# Patient Record
Sex: Male | Born: 1955 | Race: White | Hispanic: No | Smoking: Former smoker
Health system: Southern US, Community
[De-identification: ages and names within clinical notes are randomized; demographics above are authoritative.]

## PROBLEM LIST (undated history)

## (undated) DIAGNOSIS — D179 Benign lipomatous neoplasm, unspecified: Secondary | ICD-10-CM

## (undated) DIAGNOSIS — G43909 Migraine, unspecified, not intractable, without status migrainosus: Secondary | ICD-10-CM

## (undated) DIAGNOSIS — R51 Headache: Secondary | ICD-10-CM

## (undated) DIAGNOSIS — G40909 Epilepsy, unspecified, not intractable, without status epilepticus: Secondary | ICD-10-CM

## (undated) DIAGNOSIS — E669 Obesity, unspecified: Secondary | ICD-10-CM

## (undated) DIAGNOSIS — D860 Sarcoidosis of lung: Secondary | ICD-10-CM

## (undated) DIAGNOSIS — G4733 Obstructive sleep apnea (adult) (pediatric): Secondary | ICD-10-CM

## (undated) DIAGNOSIS — R079 Chest pain, unspecified: Secondary | ICD-10-CM

## (undated) DIAGNOSIS — R519 Headache, unspecified: Secondary | ICD-10-CM

## (undated) DIAGNOSIS — Z9989 Dependence on other enabling machines and devices: Secondary | ICD-10-CM

## (undated) DIAGNOSIS — K219 Gastro-esophageal reflux disease without esophagitis: Secondary | ICD-10-CM

## (undated) DIAGNOSIS — J189 Pneumonia, unspecified organism: Secondary | ICD-10-CM

## (undated) DIAGNOSIS — F329 Major depressive disorder, single episode, unspecified: Secondary | ICD-10-CM

## (undated) DIAGNOSIS — F32A Depression, unspecified: Secondary | ICD-10-CM

## (undated) DIAGNOSIS — M1990 Unspecified osteoarthritis, unspecified site: Secondary | ICD-10-CM

## (undated) DIAGNOSIS — E041 Nontoxic single thyroid nodule: Secondary | ICD-10-CM

## (undated) DIAGNOSIS — E785 Hyperlipidemia, unspecified: Secondary | ICD-10-CM

## (undated) HISTORY — PX: BIOPSY THYROID: PRO38

## (undated) HISTORY — PX: REVISION TOTAL KNEE ARTHROPLASTY: SUR1280

---
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
## 1979-09-17 HISTORY — PX: KNEE RECONSTRUCTION: SHX5883

## 2001-09-16 HISTORY — PX: TOTAL KNEE ARTHROPLASTY: SHX125

## 2002-07-29 ENCOUNTER — Emergency Department (HOSPITAL_COMMUNITY): Admission: EM | Admit: 2002-07-29 | Discharge: 2002-07-29 | Payer: Self-pay | Admitting: *Deleted

## 2002-07-29 ENCOUNTER — Encounter: Payer: Self-pay | Admitting: *Deleted

## 2003-02-28 ENCOUNTER — Encounter: Payer: Self-pay | Admitting: Orthopedic Surgery

## 2003-03-01 ENCOUNTER — Inpatient Hospital Stay (HOSPITAL_COMMUNITY): Admission: RE | Admit: 2003-03-01 | Discharge: 2003-03-05 | Payer: Self-pay | Admitting: Orthopedic Surgery

## 2003-04-15 ENCOUNTER — Inpatient Hospital Stay (HOSPITAL_COMMUNITY): Admission: EM | Admit: 2003-04-15 | Discharge: 2003-04-17 | Payer: Self-pay | Admitting: *Deleted

## 2003-04-15 ENCOUNTER — Encounter: Payer: Self-pay | Admitting: *Deleted

## 2003-04-16 ENCOUNTER — Encounter: Payer: Self-pay | Admitting: Internal Medicine

## 2003-04-17 ENCOUNTER — Encounter: Payer: Self-pay | Admitting: Internal Medicine

## 2003-07-11 ENCOUNTER — Ambulatory Visit (HOSPITAL_COMMUNITY): Admission: RE | Admit: 2003-07-11 | Discharge: 2003-07-11 | Payer: Self-pay | Admitting: Neurology

## 2003-07-11 ENCOUNTER — Encounter: Payer: Self-pay | Admitting: Neurology

## 2003-12-13 ENCOUNTER — Inpatient Hospital Stay (HOSPITAL_COMMUNITY): Admission: RE | Admit: 2003-12-13 | Discharge: 2003-12-16 | Payer: Self-pay | Admitting: Orthopedic Surgery

## 2005-03-13 ENCOUNTER — Inpatient Hospital Stay (HOSPITAL_COMMUNITY): Admission: EM | Admit: 2005-03-13 | Discharge: 2005-03-15 | Payer: Self-pay | Admitting: Emergency Medicine

## 2005-03-20 ENCOUNTER — Emergency Department (HOSPITAL_COMMUNITY): Admission: EM | Admit: 2005-03-20 | Discharge: 2005-03-20 | Payer: Self-pay | Admitting: Emergency Medicine

## 2005-06-12 ENCOUNTER — Emergency Department (HOSPITAL_COMMUNITY): Admission: EM | Admit: 2005-06-12 | Discharge: 2005-06-12 | Payer: Self-pay | Admitting: Emergency Medicine

## 2005-06-13 ENCOUNTER — Emergency Department (HOSPITAL_COMMUNITY): Admission: EM | Admit: 2005-06-13 | Discharge: 2005-06-13 | Payer: Self-pay | Admitting: Emergency Medicine

## 2005-12-04 ENCOUNTER — Emergency Department (HOSPITAL_COMMUNITY): Admission: EM | Admit: 2005-12-04 | Discharge: 2005-12-04 | Payer: Self-pay | Admitting: Emergency Medicine

## 2005-12-14 ENCOUNTER — Emergency Department (HOSPITAL_COMMUNITY): Admission: EM | Admit: 2005-12-14 | Discharge: 2005-12-14 | Payer: Self-pay | Admitting: Emergency Medicine

## 2005-12-22 ENCOUNTER — Inpatient Hospital Stay (HOSPITAL_COMMUNITY): Admission: EM | Admit: 2005-12-22 | Discharge: 2005-12-23 | Payer: Self-pay | Admitting: Emergency Medicine

## 2005-12-22 ENCOUNTER — Ambulatory Visit: Payer: Self-pay | Admitting: Family Medicine

## 2005-12-23 ENCOUNTER — Inpatient Hospital Stay (HOSPITAL_COMMUNITY): Admission: RE | Admit: 2005-12-23 | Discharge: 2005-12-26 | Payer: Self-pay | Admitting: Psychiatry

## 2005-12-24 ENCOUNTER — Ambulatory Visit: Payer: Self-pay | Admitting: Psychiatry

## 2009-09-16 HISTORY — PX: POSTERIOR LUMBAR FUSION: SHX6036

## 2013-06-04 DIAGNOSIS — J069 Acute upper respiratory infection, unspecified: Secondary | ICD-10-CM | POA: Diagnosis not present

## 2013-06-04 DIAGNOSIS — J019 Acute sinusitis, unspecified: Secondary | ICD-10-CM | POA: Diagnosis not present

## 2013-06-25 DIAGNOSIS — R197 Diarrhea, unspecified: Secondary | ICD-10-CM | POA: Diagnosis not present

## 2013-06-25 DIAGNOSIS — R1013 Epigastric pain: Secondary | ICD-10-CM | POA: Diagnosis not present

## 2013-07-19 DIAGNOSIS — F341 Dysthymic disorder: Secondary | ICD-10-CM | POA: Diagnosis not present

## 2013-07-19 DIAGNOSIS — R569 Unspecified convulsions: Secondary | ICD-10-CM | POA: Diagnosis not present

## 2013-09-22 DIAGNOSIS — R059 Cough, unspecified: Secondary | ICD-10-CM | POA: Diagnosis not present

## 2013-09-22 DIAGNOSIS — R05 Cough: Secondary | ICD-10-CM | POA: Diagnosis not present

## 2013-10-11 DIAGNOSIS — R079 Chest pain, unspecified: Secondary | ICD-10-CM | POA: Diagnosis not present

## 2013-10-18 DIAGNOSIS — M9981 Other biomechanical lesions of cervical region: Secondary | ICD-10-CM | POA: Diagnosis not present

## 2013-10-18 DIAGNOSIS — M999 Biomechanical lesion, unspecified: Secondary | ICD-10-CM | POA: Diagnosis not present

## 2013-10-18 DIAGNOSIS — M47814 Spondylosis without myelopathy or radiculopathy, thoracic region: Secondary | ICD-10-CM | POA: Diagnosis not present

## 2013-10-18 DIAGNOSIS — M5030 Other cervical disc degeneration, unspecified cervical region: Secondary | ICD-10-CM | POA: Diagnosis not present

## 2013-10-18 DIAGNOSIS — M47817 Spondylosis without myelopathy or radiculopathy, lumbosacral region: Secondary | ICD-10-CM | POA: Diagnosis not present

## 2013-10-20 DIAGNOSIS — M999 Biomechanical lesion, unspecified: Secondary | ICD-10-CM | POA: Diagnosis not present

## 2013-10-20 DIAGNOSIS — M9981 Other biomechanical lesions of cervical region: Secondary | ICD-10-CM | POA: Diagnosis not present

## 2013-10-20 DIAGNOSIS — M47814 Spondylosis without myelopathy or radiculopathy, thoracic region: Secondary | ICD-10-CM | POA: Diagnosis not present

## 2013-10-20 DIAGNOSIS — M47817 Spondylosis without myelopathy or radiculopathy, lumbosacral region: Secondary | ICD-10-CM | POA: Diagnosis not present

## 2013-10-20 DIAGNOSIS — M5030 Other cervical disc degeneration, unspecified cervical region: Secondary | ICD-10-CM | POA: Diagnosis not present

## 2013-10-22 DIAGNOSIS — M999 Biomechanical lesion, unspecified: Secondary | ICD-10-CM | POA: Diagnosis not present

## 2013-10-22 DIAGNOSIS — M9981 Other biomechanical lesions of cervical region: Secondary | ICD-10-CM | POA: Diagnosis not present

## 2013-10-22 DIAGNOSIS — M5030 Other cervical disc degeneration, unspecified cervical region: Secondary | ICD-10-CM | POA: Diagnosis not present

## 2013-10-22 DIAGNOSIS — M47814 Spondylosis without myelopathy or radiculopathy, thoracic region: Secondary | ICD-10-CM | POA: Diagnosis not present

## 2013-10-22 DIAGNOSIS — M47817 Spondylosis without myelopathy or radiculopathy, lumbosacral region: Secondary | ICD-10-CM | POA: Diagnosis not present

## 2013-10-25 DIAGNOSIS — M47814 Spondylosis without myelopathy or radiculopathy, thoracic region: Secondary | ICD-10-CM | POA: Diagnosis not present

## 2013-10-25 DIAGNOSIS — M5030 Other cervical disc degeneration, unspecified cervical region: Secondary | ICD-10-CM | POA: Diagnosis not present

## 2013-10-25 DIAGNOSIS — M999 Biomechanical lesion, unspecified: Secondary | ICD-10-CM | POA: Diagnosis not present

## 2013-10-25 DIAGNOSIS — M47817 Spondylosis without myelopathy or radiculopathy, lumbosacral region: Secondary | ICD-10-CM | POA: Diagnosis not present

## 2013-10-25 DIAGNOSIS — M9981 Other biomechanical lesions of cervical region: Secondary | ICD-10-CM | POA: Diagnosis not present

## 2013-10-29 DIAGNOSIS — M47814 Spondylosis without myelopathy or radiculopathy, thoracic region: Secondary | ICD-10-CM | POA: Diagnosis not present

## 2013-10-29 DIAGNOSIS — M47817 Spondylosis without myelopathy or radiculopathy, lumbosacral region: Secondary | ICD-10-CM | POA: Diagnosis not present

## 2013-10-29 DIAGNOSIS — M999 Biomechanical lesion, unspecified: Secondary | ICD-10-CM | POA: Diagnosis not present

## 2013-10-29 DIAGNOSIS — M5030 Other cervical disc degeneration, unspecified cervical region: Secondary | ICD-10-CM | POA: Diagnosis not present

## 2013-10-29 DIAGNOSIS — M9981 Other biomechanical lesions of cervical region: Secondary | ICD-10-CM | POA: Diagnosis not present

## 2013-11-01 DIAGNOSIS — M47814 Spondylosis without myelopathy or radiculopathy, thoracic region: Secondary | ICD-10-CM | POA: Diagnosis not present

## 2013-11-01 DIAGNOSIS — M999 Biomechanical lesion, unspecified: Secondary | ICD-10-CM | POA: Diagnosis not present

## 2013-11-01 DIAGNOSIS — M5030 Other cervical disc degeneration, unspecified cervical region: Secondary | ICD-10-CM | POA: Diagnosis not present

## 2013-11-01 DIAGNOSIS — M47817 Spondylosis without myelopathy or radiculopathy, lumbosacral region: Secondary | ICD-10-CM | POA: Diagnosis not present

## 2013-11-01 DIAGNOSIS — M9981 Other biomechanical lesions of cervical region: Secondary | ICD-10-CM | POA: Diagnosis not present

## 2013-11-02 ENCOUNTER — Encounter (HOSPITAL_COMMUNITY): Payer: Self-pay | Admitting: Emergency Medicine

## 2013-11-02 ENCOUNTER — Encounter (HOSPITAL_COMMUNITY)
Admission: EM | Disposition: A | Discharge status: Home / Self Care | Payer: Self-pay | Source: Home / Self Care | Attending: Emergency Medicine

## 2013-11-02 ENCOUNTER — Emergency Department (HOSPITAL_COMMUNITY): Payer: Medicare Other

## 2013-11-02 ENCOUNTER — Observation Stay (HOSPITAL_COMMUNITY)
Admission: EM | Admit: 2013-11-02 | Discharge: 2013-11-03 | Disposition: A | Discharge status: Home / Self Care | Payer: Medicare Other | Attending: Cardiovascular Disease | Admitting: Cardiovascular Disease

## 2013-11-02 DIAGNOSIS — F3289 Other specified depressive episodes: Secondary | ICD-10-CM | POA: Insufficient documentation

## 2013-11-02 DIAGNOSIS — R079 Chest pain, unspecified: Principal | ICD-10-CM | POA: Insufficient documentation

## 2013-11-02 DIAGNOSIS — R0602 Shortness of breath: Secondary | ICD-10-CM | POA: Diagnosis not present

## 2013-11-02 DIAGNOSIS — I200 Unstable angina: Secondary | ICD-10-CM | POA: Diagnosis not present

## 2013-11-02 DIAGNOSIS — G43909 Migraine, unspecified, not intractable, without status migrainosus: Secondary | ICD-10-CM

## 2013-11-02 DIAGNOSIS — F32A Depression, unspecified: Secondary | ICD-10-CM

## 2013-11-02 DIAGNOSIS — G4733 Obstructive sleep apnea (adult) (pediatric): Secondary | ICD-10-CM | POA: Diagnosis not present

## 2013-11-02 DIAGNOSIS — D860 Sarcoidosis of lung: Secondary | ICD-10-CM

## 2013-11-02 DIAGNOSIS — E785 Hyperlipidemia, unspecified: Secondary | ICD-10-CM | POA: Diagnosis not present

## 2013-11-02 DIAGNOSIS — F329 Major depressive disorder, single episode, unspecified: Secondary | ICD-10-CM | POA: Diagnosis not present

## 2013-11-02 DIAGNOSIS — R0609 Other forms of dyspnea: Secondary | ICD-10-CM

## 2013-11-02 DIAGNOSIS — R071 Chest pain on breathing: Secondary | ICD-10-CM | POA: Diagnosis not present

## 2013-11-02 DIAGNOSIS — E049 Nontoxic goiter, unspecified: Secondary | ICD-10-CM | POA: Diagnosis not present

## 2013-11-02 DIAGNOSIS — R0789 Other chest pain: Secondary | ICD-10-CM

## 2013-11-02 DIAGNOSIS — Z880 Allergy status to penicillin: Secondary | ICD-10-CM | POA: Diagnosis not present

## 2013-11-02 DIAGNOSIS — G40909 Epilepsy, unspecified, not intractable, without status epilepticus: Secondary | ICD-10-CM

## 2013-11-02 HISTORY — DX: Benign lipomatous neoplasm, unspecified: D17.9

## 2013-11-02 HISTORY — DX: Obesity, unspecified: E66.9

## 2013-11-02 HISTORY — DX: Gastro-esophageal reflux disease without esophagitis: K21.9

## 2013-11-02 HISTORY — PX: LEFT HEART CATHETERIZATION WITH CORONARY ANGIOGRAM: SHX5451

## 2013-11-02 HISTORY — DX: Nontoxic single thyroid nodule: E04.1

## 2013-11-02 HISTORY — DX: Hyperlipidemia, unspecified: E78.5

## 2013-11-02 HISTORY — DX: Dependence on other enabling machines and devices: Z99.89

## 2013-11-02 HISTORY — DX: Pneumonia, unspecified organism: J18.9

## 2013-11-02 HISTORY — DX: Obstructive sleep apnea (adult) (pediatric): G47.33

## 2013-11-02 HISTORY — DX: Headache, unspecified: R51.9

## 2013-11-02 HISTORY — DX: Major depressive disorder, single episode, unspecified: F32.9

## 2013-11-02 HISTORY — DX: Unspecified osteoarthritis, unspecified site: M19.90

## 2013-11-02 HISTORY — DX: Epilepsy, unspecified, not intractable, without status epilepticus: G40.909

## 2013-11-02 HISTORY — DX: Sarcoidosis of lung: D86.0

## 2013-11-02 HISTORY — DX: Migraine, unspecified, not intractable, without status migrainosus: G43.909

## 2013-11-02 HISTORY — DX: Headache: R51

## 2013-11-02 HISTORY — DX: Depression, unspecified: F32.A

## 2013-11-02 HISTORY — PX: CARDIAC CATHETERIZATION: SHX172

## 2013-11-02 HISTORY — DX: Chest pain, unspecified: R07.9

## 2013-11-02 LAB — CBC WITH DIFFERENTIAL/PLATELET
BASOS ABS: 0 10*3/uL (ref 0.0–0.1)
Basophils Relative: 0 % (ref 0–1)
EOS ABS: 0.1 10*3/uL (ref 0.0–0.7)
EOS PCT: 3 % (ref 0–5)
HCT: 41.4 % (ref 39.0–52.0)
Hemoglobin: 14.6 g/dL (ref 13.0–17.0)
Lymphocytes Relative: 22 % (ref 12–46)
Lymphs Abs: 1.1 10*3/uL (ref 0.7–4.0)
MCH: 30.5 pg (ref 26.0–34.0)
MCHC: 35.3 g/dL (ref 30.0–36.0)
MCV: 86.4 fL (ref 78.0–100.0)
Monocytes Absolute: 0.5 10*3/uL (ref 0.1–1.0)
Monocytes Relative: 10 % (ref 3–12)
Neutro Abs: 3.2 10*3/uL (ref 1.7–7.7)
Neutrophils Relative %: 64 % (ref 43–77)
PLATELETS: 254 10*3/uL (ref 150–400)
RBC: 4.79 MIL/uL (ref 4.22–5.81)
RDW: 13.6 % (ref 11.5–15.5)
WBC: 5 10*3/uL (ref 4.0–10.5)

## 2013-11-02 LAB — BASIC METABOLIC PANEL
BUN: 16 mg/dL (ref 6–23)
CO2: 21 mEq/L (ref 19–32)
Calcium: 9.1 mg/dL (ref 8.4–10.5)
Chloride: 108 mEq/L (ref 96–112)
Creatinine, Ser: 0.95 mg/dL (ref 0.50–1.35)
GLUCOSE: 117 mg/dL — AB (ref 70–99)
Potassium: 4.3 mEq/L (ref 3.7–5.3)
SODIUM: 142 meq/L (ref 137–147)

## 2013-11-02 LAB — POCT I-STAT TROPONIN I: TROPONIN I, POC: 0 ng/mL (ref 0.00–0.08)

## 2013-11-02 LAB — POCT ACTIVATED CLOTTING TIME: Activated Clotting Time: 88 seconds

## 2013-11-02 LAB — TROPONIN I: Troponin I: <0.3 ng/mL (ref <0.30)

## 2013-11-02 SURGERY — LEFT HEART CATHETERIZATION WITH CORONARY ANGIOGRAM
Anesthesia: LOCAL

## 2013-11-02 MED ORDER — SODIUM CHLORIDE 0.9 % IV SOLN
INTRAVENOUS | Status: DC
  Administered 2013-11-02: 15:00:00 via INTRAVENOUS

## 2013-11-02 MED ORDER — VERAPAMIL HCL 2.5 MG/ML IV SOLN
INTRAVENOUS | Status: AC
  Filled 2013-11-02: qty 4

## 2013-11-02 MED ORDER — NITROGLYCERIN 0.4 MG SL SUBL: 0.4000 mg | SUBLINGUAL_TABLET | SUBLINGUAL | Status: DC | PRN

## 2013-11-02 MED ORDER — PAROXETINE HCL 20 MG PO TABS
40.0000 mg | ORAL_TABLET | Freq: Every day | ORAL | Status: DC
  Administered 2013-11-03: 09:00:00 40 mg via ORAL
  Filled 2013-11-02 (×2): qty 2

## 2013-11-02 MED ORDER — MIDAZOLAM HCL 2 MG/2ML IJ SOLN
INTRAMUSCULAR | Status: AC
  Filled 2013-11-02: qty 2

## 2013-11-02 MED ORDER — LIDOCAINE HCL (PF) 1 % IJ SOLN
INTRAMUSCULAR | Status: AC
  Filled 2013-11-02: qty 30

## 2013-11-02 MED ORDER — HEPARIN BOLUS VIA INFUSION
4000.0000 [IU] | Freq: Once | INTRAVENOUS | Status: AC
  Administered 2013-11-02: 4000 [IU] via INTRAVENOUS
  Filled 2013-11-02: qty 4000

## 2013-11-02 MED ORDER — HEPARIN (PORCINE) IN NACL 100-0.45 UNIT/ML-% IJ SOLN
1150.0000 [IU]/h | INTRAMUSCULAR | Status: DC
  Administered 2013-11-02: 1150 [IU]/h via INTRAVENOUS
  Filled 2013-11-02: qty 250

## 2013-11-02 MED ORDER — HEPARIN (PORCINE) IN NACL 2-0.9 UNIT/ML-% IJ SOLN
INTRAMUSCULAR | Status: AC
Start: 2013-11-02 — End: 2013-11-02
  Filled 2013-11-02: qty 1500

## 2013-11-02 MED ORDER — FENTANYL CITRATE 0.05 MG/ML IJ SOLN
INTRAMUSCULAR | Status: AC
  Filled 2013-11-02: qty 2

## 2013-11-02 MED ORDER — PANTOPRAZOLE SODIUM 40 MG PO TBEC
40.0000 mg | DELAYED_RELEASE_TABLET | Freq: Every day | ORAL | Status: DC
  Administered 2013-11-02 – 2013-11-03 (×2): 40 mg via ORAL
  Filled 2013-11-02: qty 1

## 2013-11-02 MED ORDER — HEPARIN SODIUM (PORCINE) 1000 UNIT/ML IJ SOLN
INTRAMUSCULAR | Status: AC
  Filled 2013-11-02: qty 1

## 2013-11-02 MED ORDER — SODIUM CHLORIDE 0.9 % IV SOLN
INTRAVENOUS | Status: DC
Start: 2013-11-02 — End: 2013-11-03

## 2013-11-02 MED ORDER — LAMOTRIGINE 25 MG PO TABS: 25.0000 mg | ORAL_TABLET | Freq: Two times a day (BID) | ORAL | Status: DC

## 2013-11-02 MED ORDER — NITROGLYCERIN 0.2 MG/ML ON CALL CATH LAB
INTRAVENOUS | Status: AC
  Filled 2013-11-02: qty 1

## 2013-11-02 MED ORDER — ASPIRIN 81 MG PO CHEW
324.0000 mg | CHEWABLE_TABLET | Freq: Once | ORAL | Status: AC
  Administered 2013-11-02: 324 mg via ORAL
  Filled 2013-11-02: qty 4

## 2013-11-02 MED ORDER — GABAPENTIN 600 MG PO TABS
600.0000 mg | ORAL_TABLET | Freq: Four times a day (QID) | ORAL | Status: DC
  Administered 2013-11-02 – 2013-11-03 (×4): 600 mg via ORAL
  Filled 2013-11-02 (×6): qty 1

## 2013-11-02 MED ORDER — LAMOTRIGINE 25 MG PO TABS
125.0000 mg | ORAL_TABLET | Freq: Two times a day (BID) | ORAL | Status: DC
  Administered 2013-11-02 – 2013-11-03 (×2): 125 mg via ORAL
  Filled 2013-11-02 (×3): qty 1

## 2013-11-02 MED ORDER — LAMOTRIGINE 100 MG PO TABS: 100.0000 mg | ORAL_TABLET | Freq: Two times a day (BID) | ORAL | Status: DC

## 2013-11-02 NOTE — ED Notes (Signed)
Pt in c/o chest pain x40 min that started while he was shoveling snow, pain to left side of chest without radiation, also c/o numbness to right arm, shortness of breath, denies other symptoms, pt has history of similar pain but states it normally resolves quickly
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
## 2013-11-02 NOTE — Progress Notes (Signed)
ANTICOAGULATION CONSULT NOTE - Initial Consult  Pharmacy Consult for heparin Indication: chest pain/ACS  Allergies  Allergen Reactions  . Penicillins Other (See Comments)    Dizziness,headache    Patient Measurements: Weight: 205 lb (92.987 kg) Heparin Dosing Weight:   Vital Signs: Temp: 98.1 F (36.7 C) (02/17 1259) Temp src: Oral (02/17 1259) BP: 110/80 mmHg (02/17 1330) Pulse Rate: 81 (02/17 1315)  Labs:  Recent Labs  11/02/13 1308 11/02/13 1320  HGB 14.6  --   HCT 41.4  --   PLT 254  --   CREATININE 0.95  --   TROPONINI  --  <0.30    CrCl is unknown because there is no height on file for the current visit.   Medical History: Past Medical History  Diagnosis Date  . Seizures   . Migraines     Medications:  See med rec  Assessment: 58 yo man to start heparin for CP. He was on no anticoagulants PTA.  Baseline Hg 14.6 Goal of Therapy:  Heparin level 0.3-0.7 units/ml Monitor platelets by anticoagulation protocol: Yes   Plan:  Heparin bolus 4000 units and drip at 1150 units/hr Check heparin level 6 hours after start. Check daily CBC Monitor for bleeding.  Thanks for allowing pharmacy to be a part of this patient's care.  Excell Seltzer, PharmD Clinical Pharmacist, [phone]  11/02/2013,2:04 PM

## 2013-11-02 NOTE — ED Notes (Signed)
Dr. Ellyn Hack at Bedside.
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
## 2013-11-02 NOTE — ED Notes (Signed)
Patient transported to X-ray 

## 2013-11-02 NOTE — CV Procedure (Addendum)
Mathew Mercado is a 58 y.o. male    [id number]  [id number] LOCATION:  FACILITY: [facility]  PHYSICIAN: Troy Sine, MD, [facility] 01-11-1956   DATE OF PROCEDURE:  11/02/2013    CARDIAC CATHETERIZATION     HISTORY: Mathew Mercado is a 58 year old male who is followed at the [place]. He has a history of shortness of breath, pulmonary sarcoidosis, obesity, hyperlipidemia, obstructive sleep apnea on CPAP therapy, lipomas, and has experienced episodes of left-sided chest pain for several months. His pain is worse with exertion and relieved with rest rest but at times is sharp pain that evolved into dull pressure discomfort. He was scheduled to undergo a stress test at the [facility] in several weeks but due to  recurrent symptomatology today leading to his [facility]  Emergency room evaluation. He was evaluated by Dr. Ellyn Hack who recommended definitive cardiac catheterization.   PROCEDURE:  The patient was brought to the second floor [place] Cardiac cath lab in the postabsorptive state. Initially a right radial approach was attempted after demonstration of a positive modified Allen's test. After premedication with Versed 2 mg and fentanyl 50 mcg initially, the patient was prepped and draped in sterile fashion. A 108F glidesheath slender was inserted and a radial cocktail consisting of verapamil, nitroglycerin, and lidocaine was administered. Initially a right 5 [place] JR 4 catheter was inserted over a safety J. wire but in the upper arm the safety J wire met resistance. This was then exchanged for versicore wire which was then easily able to traverse into the innominate artery. However, due to  significant vessel tortuosity and after multiple attempts to  pass the wire into the ascending aorta which were unsuccessful this approach was aborted and the procedure was done via the right femoral approach. Of note, there was significant tortuosity of the trachea with significant angulation. The right groin was prepped and  draped in usual fashion. A 5 French sheath was inserted into the right femoral artery. Diagnostic catheterization was done utilizing Judkins 3.5 left catheter which was initially out for the radial approach and the JR 4 catheter. 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 A 5 French pigtail catheter was used for left ventriculography which was done in both the RAO and LAO projections to better visualize the posterior lateral wall.  HEMODYNAMICS:   Central Aorta:  100/66   Left Ventricle: 100/17/25  ANGIOGRAPHY:  1. Left main: Normal vessel which alternately bifurcated into a ramus intermediate vessel and LAD vessel. The left circumflex coronary artery did not arise from the left main. 2. LAD: Angiographically normal vessel that gave rise to several small proximal septal perforator arteries. The LAD extended to the LV apex. 3. Ramus intermediate vessel was large caliber normal vessel that extended towards the apex. 4. Left circumflex: Very large vessel that had anomalous origin arising from its own ostium in the right coronary cusp distinct from the large dominant right coronary artery. The circumflex vessel wrapped around to the back of the heart and gave rise to 3 major large branches which are all angiographically normal without obstructive stenoses. 5. Right coronary artery: Large dominant vessel that gave rise to a large PDA and posterior lateral coronary artery. The RCA and its branches were angiographically normal.   Left ventriculography revealed normal global the contractility with an ejection fraction of approximately 55%. On the RAO projection  there was a question of subtle mild mid anterolateral hypocontractility and for this reason the LAO projection was done to better evaluate the posterolateral wall. On the LAO projection, contractility appeared grossly  normal but the border of the posterior lateral wall was not optimally visualized.   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  RECOMMENDATION:  Mathew. Mercado cardiac catheterization does not disclose fixed obstructive CAD. 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 Further evaluation with CT angio or cardiac MRI is recommended to elucidate the path of this circumflex vessel relative to the pulmonary artery and aorta.  Troy Sine, MD, [place] 11/02/2013 5:37 PM

## 2013-11-03 ENCOUNTER — Encounter (HOSPITAL_COMMUNITY): Payer: Self-pay | Admitting: Nurse Practitioner

## 2013-11-03 ENCOUNTER — Observation Stay (HOSPITAL_COMMUNITY): Payer: Medicare Other

## 2013-11-03 DIAGNOSIS — Q248 Other specified congenital malformations of heart: Secondary | ICD-10-CM

## 2013-11-03 DIAGNOSIS — G40909 Epilepsy, unspecified, not intractable, without status epilepticus: Secondary | ICD-10-CM

## 2013-11-03 DIAGNOSIS — R0609 Other forms of dyspnea: Secondary | ICD-10-CM

## 2013-11-03 DIAGNOSIS — E785 Hyperlipidemia, unspecified: Secondary | ICD-10-CM

## 2013-11-03 DIAGNOSIS — G43909 Migraine, unspecified, not intractable, without status migrainosus: Secondary | ICD-10-CM

## 2013-11-03 DIAGNOSIS — R0789 Other chest pain: Secondary | ICD-10-CM

## 2013-11-03 DIAGNOSIS — G4733 Obstructive sleep apnea (adult) (pediatric): Secondary | ICD-10-CM

## 2013-11-03 DIAGNOSIS — F32A Depression, unspecified: Secondary | ICD-10-CM

## 2013-11-03 DIAGNOSIS — F329 Major depressive disorder, single episode, unspecified: Secondary | ICD-10-CM

## 2013-11-03 DIAGNOSIS — D860 Sarcoidosis of lung: Secondary | ICD-10-CM

## 2013-11-03 LAB — CBC
HEMATOCRIT: 40.2 % (ref 39.0–52.0)
Hemoglobin: 13.7 g/dL (ref 13.0–17.0)
MCH: 29.8 pg (ref 26.0–34.0)
MCHC: 34.1 g/dL (ref 30.0–36.0)
MCV: 87.4 fL (ref 78.0–100.0)
Platelets: 219 10*3/uL (ref 150–400)
RBC: 4.6 MIL/uL (ref 4.22–5.81)
RDW: 13.8 % (ref 11.5–15.5)
WBC: 4.7 10*3/uL (ref 4.0–10.5)

## 2013-11-03 MED ORDER — METOPROLOL TARTRATE 25 MG PO TABS
25.0000 mg | ORAL_TABLET | Freq: Once | ORAL | Status: AC
  Administered 2013-11-03: 12:00:00 25 mg via ORAL
  Filled 2013-11-03: qty 1

## 2013-11-03 MED ORDER — METOPROLOL TARTRATE 1 MG/ML IV SOLN
2.5000 mg | INTRAVENOUS | Status: AC
  Administered 2013-11-03: 2.5 mg via INTRAVENOUS
  Filled 2013-11-03: qty 5

## 2013-11-03 MED ORDER — METOPROLOL TARTRATE 1 MG/ML IV SOLN: 2.5000 mg | INTRAVENOUS | Status: DC

## 2013-11-03 MED ORDER — METOPROLOL TARTRATE 25 MG PO TABS
25.0000 mg | ORAL_TABLET | Freq: Once | ORAL | Status: AC
  Administered 2013-11-03: 25 mg via ORAL
  Filled 2013-11-03: qty 1

## 2013-11-03 MED ORDER — IOHEXOL 350 MG/ML SOLN
80.0000 mL | Freq: Once | INTRAVENOUS | Status: AC | PRN
  Administered 2013-11-03: 16:00:00 80 mL via INTRAVENOUS

## 2013-11-03 MED ORDER — NITROGLYCERIN 0.4 MG SL SUBL
SUBLINGUAL_TABLET | SUBLINGUAL | Status: AC
  Filled 2013-11-03: qty 25

## 2013-11-03 MED ORDER — ACETAMINOPHEN 325 MG PO TABS
650.0000 mg | ORAL_TABLET | ORAL | Status: DC | PRN
  Administered 2013-11-03: 17:00:00 650 mg via ORAL
  Filled 2013-11-03: qty 2

## 2013-11-03 NOTE — Progress Notes (Signed)
   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  No chest pain or sob overnight.  Eager to go home.  CURRENT MEDS . gabapentin  600 mg Oral QID  . lamoTRIgine  125 mg Oral BID  . pantoprazole  40 mg Oral Daily  . PARoxetine  40 mg Oral Daily   OBJECTIVE  Filed Vitals:   11/02/13 2200 11/03/13 0031 11/03/13 0610 11/03/13 0756  BP: 99/57 124/77 93/61 110/79  Pulse: 75 77 74 75  Temp:  97.7 F (36.5 C) 98 F (36.7 C) 98.1 F (36.7 C)  TempSrc:  Oral Oral Oral  Resp:  20 20 18   Weight:  205 lb 14.6 oz (93.4 kg)    SpO2: 92% 95% 94% 95%    Intake/Output Summary (Last 24 hours) at 11/03/13 0803 Last data filed at 11/03/13 7824  Gross per 24 hour  Intake 1127.5 ml  Output    650 ml  Net  477.5 ml   Filed Weights   11/02/13 1259 11/03/13 0031  Weight: 205 lb (92.987 kg) 205 lb 14.6 oz (93.4 kg)   PHYSICAL EXAM  General: Pleasant, NAD. Neuro: Alert and oriented X 3. Moves all extremities spontaneously. Psych: Normal affect. HEENT:  Normal  Neck: Supple without bruits or JVD. Lungs:  Resp regular and unlabored, CTA. Heart: RRR no s3, s4, or murmurs. Abdomen: Soft, non-tender, non-distended, BS + x 4.  Extremities: No clubbing, cyanosis or edema. DP/PT/Radials 2+ and equal bilaterally.  Accessory Clinical Findings  CBC  Recent Labs  11/02/13 1308 11/03/13 0515  WBC 5.0 4.7  NEUTROABS 3.2  --   HGB 14.6 13.7  HCT 41.4 40.2  MCV 86.4 87.4  PLT 254 235   Basic Metabolic Panel  Recent Labs  11/02/13 1308  NA 142  K 4.3  CL 108  CO2 21  GLUCOSE 117*  BUN 16  CREATININE 0.95  CALCIUM  9.1   Cardiac Enzymes  Recent Labs  11/02/13 1320  TROPONINI <0.30   TELE  rsr  Radiology/Studies  Dg Chest 2 View  11/02/2013   CLINICAL DATA:  Chest pain  EXAM: CHEST  2 VIEW  COMPARISON:  03/13/2005  FINDINGS: The heart size and mediastinal contours are within normal limits. Both lungs are clear. The visualized skeletal structures are unremarkable.  IMPRESSION: No active cardiopulmonary disease.   Electronically Signed   By: Kerby Moors M.D.   On: 11/02/2013 13:56   ASSESSMENT AND PLAN  1.  Midsternal chest pain:  S/p cath yesterday revealing nl cors with anomalous takeoff of LCX from its own ostium in the RCA cusp.  Vessel was patent with 3 large, patent, vessels.  Dr. Claiborne Billings rec Cardiac CT vs. MRI to elucidate the path of the LCX relative to PA and Ao.  This has not yet been ordered and we can arrange for this to be done as an outpt.  2.  Pulm Sarcoidosis:  Not on any meds for this.  Certainly could be contributing to Ss.  3.  Sz D/O:  Cont home meds.  Signed, Murray Hodgkins NP

## 2013-11-03 NOTE — Progress Notes (Signed)
The patient was examined, interviewed and cath sites inspected. He has anomalous origin of the CFX. Will need OP CT angio to determine course. No change in medical therapy. Will discharge today.

## 2013-11-03 NOTE — Discharge Summary (Signed)
Discharge Summary   Patient ID: Mathew Mercado,  MRN: [id number], DOB/AGE: 56-21-1957 56 y.o.  Admit date: 11/02/2013 Discharge date: 11/03/2013  Primary Care Provider: Milagros Evener Primary Cardiologist: Roni Bread   Discharge Diagnoses Principal Problem:   Midsternal chest pain  **Status post catheterization this admission revealing normal coronary arteries with an anomalous takeoff of the left circumflex.  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  Active Problems:   DOE (dyspnea on exertion)   Pulmonary sarcoidosis  **Prev on steroids in the 90's.  Not seen by pulmonology in many years.   Morbid obesity   Hyperlipidemia   Obstructive sleep apnea on CPAP   Depression   Migraines   Seizure disorder  Allergies Allergies  Allergen Reactions  . Penicillins Other (See Comments)    Dizziness,headache    Procedures  Cardiac Catheterization 2.17.2015  HEMODYNAMICS:    Central Aorta:  100/66 Left Ventricle: 100/17/25  ANGIOGRAPHY:  1. Left main: Normal vessel which alternately bifurcated into a ramus intermediate vessel and LAD vessel. The left circumflex coronary artery did not arise from the left main. 2. LAD: Angiographically normal vessel that gave rise to several small proximal septal perforator arteries. The LAD extended to the LV apex. 3. Ramus intermediate vessel was large caliber normal vessel that extended towards the apex. 4. Left circumflex: Very large vessel that had anomalous origin arising from its own ostium in the right coronary cusp distinct from the large dominant right coronary artery. The circumflex vessel wrapped around to the back of the heart and gave rise to 3 major large branches which are all angiographically normal without obstructive stenoses. 5. Right coronary artery: Large dominant vessel that gave  rise to a large PDA and posterior lateral coronary artery. The RCA and its branches were angiographically normal.  Left ventriculography revealed normal global the contractility with an ejection fraction of approximately 55%. On the RAO projection there was a question of subtle mild mid anterolateral hypocontractility and for this reason the LAO projection was done to better evaluate the posterolateral wall. On the LAO projection, contractility appeared grossly normal but the border of the posterior lateral wall was not optimally visualized. _____________   Cardiac CT Angiography 2.18.2015 IMPRESSION: 1. 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 2. No coronary artery disease.  History of Present Illness  58 year old male with prior history of seizure disorder, hyperlipidemia, pulmonary sarcoidosis without requirement for ongoing treatment, depression, migraines, and obesity who is recently been experiencing left-sided chest discomfort with dyspnea. He has been followed at the [facility] and apparently had a stress test scheduled to be performed as an outpatient in a couple of weeks however on February 17, he developed worsening both sharp and dull aching chest discomfort associated with dyspnea while shoveling snow. He presented to the [facility] where ECG was nonacute and troponin was normal. He was admitted for further evaluation.  Hospital Course  Patient ruled out for myocardial infarction. He was felt that his symptoms were consistent with unstable angina and given his risk factors, decision was made to pursue diagnostic catheterization. This was performed on February 17, and revealed normal coronary arteries with normal LV function. Of note, the left circumflex artery had an anomalous takeoff of the right coronary cusp though runoff was otherwise normal. 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   Discharge Vitals Blood pressure 110/79, pulse 75, temperature 98.1 F (36.7 C), temperature source Oral, resp. rate 18, weight 205 lb 14.6 oz (93.4 kg), SpO2 95.00%.  Filed Weights   11/02/13 1259 11/03/13 0031  Weight: 205 lb (92.987 kg) 205 lb 14.6 oz (93.4 kg)   Labs  CBC  Recent Labs  11/02/13 1308 11/03/13 0515  WBC 5.0 4.7  NEUTROABS 3.2  --   HGB 14.6 13.7  HCT 41.4 40.2  MCV 86.4 87.4  PLT 254 102   Basic Metabolic Panel  Recent Labs  11/02/13 1308  NA 142  K 4.3  CL 108  CO2 21  GLUCOSE 117*  BUN 16  CREATININE 0.95  CALCIUM 9.1   Cardiac Enzymes  Recent Labs  11/02/13 1320  TROPONINI <0.30   Disposition  Pt is being discharged home today in good condition.  Follow-up Plans & Appointments  Follow-up Information   Follow up with Milagros Evener, MD. (as scheduled)    Specialty:  Family Medicine   Contact information:   [place]. [place] [address] [phone]       Follow up with Leonie Man, MD. (As needed)    Specialty:  Cardiology   Contact information:   [address] [place] [address] [phone]       Follow up with [facility]. (as scheduled.)      Discharge Medications    Medication List         CALCIUM PO  Take 1 tablet by mouth daily.     gabapentin 600 MG tablet  Commonly known as:  NEURONTIN  Take 600 mg by mouth 4 (four) times daily.     lamoTRIgine 100 MG tablet  Commonly known as:  LAMICTAL  Take 100 mg by mouth 2 (two) times daily. Take with 25mg  tablet     lamoTRIgine 25 MG tablet  Commonly known as:  LAMICTAL  Take 25 mg by mouth 2 (two) times daily. Take with 100mg  tablet     loratadine 10 MG tablet  Commonly known as:  CLARITIN  Take 10 mg by mouth  daily as needed for allergies.     multivitamin with minerals Tabs tablet  Take 1 tablet by mouth daily.     omeprazole 40 MG capsule  Commonly known as:  PRILOSEC  Take 40 mg by mouth 2 (two) times daily.     PARoxetine 40 MG tablet  Commonly known as:  PAXIL  Take 40 mg by mouth every morning.     SUMAtriptan 100 MG tablet  Commonly known as:  IMITREX  Take 50 mg by mouth every 2 (two) hours as needed for migraine or headache. May repeat in 2 hours if headache persists or recurs.     VITAMIN B COMPLEX PO  Take 1 capsule by mouth daily.     VITAMIN C PO  Take 1 tablet by mouth daily.     VITAMIN D PO  Take 1 capsule by mouth daily.     ZINC PO  Take 1 tablet by mouth daily.        Outstanding Labs/Studies  None  Duration of Discharge Encounter   Greater than 30 minutes including physician time.  Signed, Murray Hodgkins NP 11/03/2013, 10:17  AM    

## 2013-11-04 DIAGNOSIS — M47817 Spondylosis without myelopathy or radiculopathy, lumbosacral region: Secondary | ICD-10-CM | POA: Diagnosis not present

## 2013-11-04 DIAGNOSIS — M5030 Other cervical disc degeneration, unspecified cervical region: Secondary | ICD-10-CM | POA: Diagnosis not present

## 2013-11-04 DIAGNOSIS — M9981 Other biomechanical lesions of cervical region: Secondary | ICD-10-CM | POA: Diagnosis not present

## 2013-11-04 DIAGNOSIS — M999 Biomechanical lesion, unspecified: Secondary | ICD-10-CM | POA: Diagnosis not present

## 2013-11-04 DIAGNOSIS — M47814 Spondylosis without myelopathy or radiculopathy, thoracic region: Secondary | ICD-10-CM | POA: Diagnosis not present

## 2013-11-04 NOTE — Discharge Summary (Signed)
The patient was seen and examined. His plan of care was directed. He will f/u with Dr. Ellyn Hack.

## 2013-11-08 DIAGNOSIS — R079 Chest pain, unspecified: Secondary | ICD-10-CM | POA: Diagnosis not present

## 2013-11-09 DIAGNOSIS — M47817 Spondylosis without myelopathy or radiculopathy, lumbosacral region: Secondary | ICD-10-CM | POA: Diagnosis not present

## 2013-11-09 DIAGNOSIS — M9981 Other biomechanical lesions of cervical region: Secondary | ICD-10-CM | POA: Diagnosis not present

## 2013-11-09 DIAGNOSIS — M47814 Spondylosis without myelopathy or radiculopathy, thoracic region: Secondary | ICD-10-CM | POA: Diagnosis not present

## 2013-11-09 DIAGNOSIS — M999 Biomechanical lesion, unspecified: Secondary | ICD-10-CM | POA: Diagnosis not present

## 2013-11-09 DIAGNOSIS — M5030 Other cervical disc degeneration, unspecified cervical region: Secondary | ICD-10-CM | POA: Diagnosis not present

## 2013-11-12 DIAGNOSIS — M999 Biomechanical lesion, unspecified: Secondary | ICD-10-CM | POA: Diagnosis not present

## 2013-11-12 DIAGNOSIS — M47814 Spondylosis without myelopathy or radiculopathy, thoracic region: Secondary | ICD-10-CM | POA: Diagnosis not present

## 2013-11-12 DIAGNOSIS — M9981 Other biomechanical lesions of cervical region: Secondary | ICD-10-CM | POA: Diagnosis not present

## 2013-11-12 DIAGNOSIS — M5030 Other cervical disc degeneration, unspecified cervical region: Secondary | ICD-10-CM | POA: Diagnosis not present

## 2013-11-12 DIAGNOSIS — M47817 Spondylosis without myelopathy or radiculopathy, lumbosacral region: Secondary | ICD-10-CM | POA: Diagnosis not present

## 2013-11-19 DIAGNOSIS — M47817 Spondylosis without myelopathy or radiculopathy, lumbosacral region: Secondary | ICD-10-CM | POA: Diagnosis not present

## 2013-11-19 DIAGNOSIS — M9981 Other biomechanical lesions of cervical region: Secondary | ICD-10-CM | POA: Diagnosis not present

## 2013-11-19 DIAGNOSIS — M47814 Spondylosis without myelopathy or radiculopathy, thoracic region: Secondary | ICD-10-CM | POA: Diagnosis not present

## 2013-11-19 DIAGNOSIS — M5030 Other cervical disc degeneration, unspecified cervical region: Secondary | ICD-10-CM | POA: Diagnosis not present

## 2013-11-19 DIAGNOSIS — M999 Biomechanical lesion, unspecified: Secondary | ICD-10-CM | POA: Diagnosis not present

## 2013-11-23 DIAGNOSIS — M9981 Other biomechanical lesions of cervical region: Secondary | ICD-10-CM | POA: Diagnosis not present

## 2013-11-23 DIAGNOSIS — M47814 Spondylosis without myelopathy or radiculopathy, thoracic region: Secondary | ICD-10-CM | POA: Diagnosis not present

## 2013-11-23 DIAGNOSIS — M999 Biomechanical lesion, unspecified: Secondary | ICD-10-CM | POA: Diagnosis not present

## 2013-11-23 DIAGNOSIS — M47817 Spondylosis without myelopathy or radiculopathy, lumbosacral region: Secondary | ICD-10-CM | POA: Diagnosis not present

## 2013-11-23 DIAGNOSIS — M5030 Other cervical disc degeneration, unspecified cervical region: Secondary | ICD-10-CM | POA: Diagnosis not present

## 2013-12-02 DIAGNOSIS — M999 Biomechanical lesion, unspecified: Secondary | ICD-10-CM | POA: Diagnosis not present

## 2013-12-02 DIAGNOSIS — M9981 Other biomechanical lesions of cervical region: Secondary | ICD-10-CM | POA: Diagnosis not present

## 2013-12-02 DIAGNOSIS — M47814 Spondylosis without myelopathy or radiculopathy, thoracic region: Secondary | ICD-10-CM | POA: Diagnosis not present

## 2013-12-02 DIAGNOSIS — M47817 Spondylosis without myelopathy or radiculopathy, lumbosacral region: Secondary | ICD-10-CM | POA: Diagnosis not present

## 2013-12-02 DIAGNOSIS — M5030 Other cervical disc degeneration, unspecified cervical region: Secondary | ICD-10-CM | POA: Diagnosis not present

## 2013-12-16 DIAGNOSIS — M19049 Primary osteoarthritis, unspecified hand: Secondary | ICD-10-CM | POA: Diagnosis not present

## 2013-12-16 DIAGNOSIS — M6530 Trigger finger, unspecified finger: Secondary | ICD-10-CM | POA: Diagnosis not present

## 2014-01-14 DIAGNOSIS — M6530 Trigger finger, unspecified finger: Secondary | ICD-10-CM | POA: Diagnosis not present

## 2014-02-28 DIAGNOSIS — M6530 Trigger finger, unspecified finger: Secondary | ICD-10-CM | POA: Diagnosis not present

## 2014-05-19 ENCOUNTER — Other Ambulatory Visit: Payer: Self-pay | Admitting: Neurological Surgery

## 2014-05-19 DIAGNOSIS — IMO0002 Reserved for concepts with insufficient information to code with codable children: Secondary | ICD-10-CM

## 2014-05-19 DIAGNOSIS — Z6833 Body mass index (BMI) 33.0-33.9, adult: Secondary | ICD-10-CM | POA: Diagnosis not present

## 2014-05-19 DIAGNOSIS — M479 Spondylosis, unspecified: Secondary | ICD-10-CM | POA: Diagnosis not present

## 2014-05-19 DIAGNOSIS — M545 Low back pain, unspecified: Secondary | ICD-10-CM | POA: Diagnosis not present

## 2014-05-19 DIAGNOSIS — Z981 Arthrodesis status: Secondary | ICD-10-CM | POA: Diagnosis not present

## 2014-05-25 DIAGNOSIS — M47812 Spondylosis without myelopathy or radiculopathy, cervical region: Secondary | ICD-10-CM | POA: Diagnosis not present

## 2014-05-25 DIAGNOSIS — M479 Spondylosis, unspecified: Secondary | ICD-10-CM | POA: Diagnosis not present

## 2014-05-25 DIAGNOSIS — M4040 Postural lordosis, site unspecified: Secondary | ICD-10-CM | POA: Diagnosis not present

## 2014-05-30 ENCOUNTER — Ambulatory Visit
Admission: RE | Admit: 2014-05-30 | Discharge: 2014-05-30 | Disposition: A | Discharge status: Home / Self Care | Payer: Medicare Other | Source: Ambulatory Visit | Attending: Neurological Surgery | Admitting: Neurological Surgery

## 2014-05-30 DIAGNOSIS — M5137 Other intervertebral disc degeneration, lumbosacral region: Secondary | ICD-10-CM | POA: Diagnosis not present

## 2014-05-30 DIAGNOSIS — M5126 Other intervertebral disc displacement, lumbar region: Secondary | ICD-10-CM | POA: Diagnosis not present

## 2014-05-30 DIAGNOSIS — IMO0002 Reserved for concepts with insufficient information to code with codable children: Secondary | ICD-10-CM

## 2014-05-30 DIAGNOSIS — M4310 Spondylolisthesis, site unspecified: Secondary | ICD-10-CM | POA: Diagnosis not present

## 2014-05-30 DIAGNOSIS — M47817 Spondylosis without myelopathy or radiculopathy, lumbosacral region: Secondary | ICD-10-CM | POA: Diagnosis not present

## 2014-06-02 DIAGNOSIS — Z6832 Body mass index (BMI) 32.0-32.9, adult: Secondary | ICD-10-CM | POA: Diagnosis not present

## 2014-06-02 DIAGNOSIS — M479 Spondylosis, unspecified: Secondary | ICD-10-CM | POA: Diagnosis not present

## 2014-07-01 ENCOUNTER — Other Ambulatory Visit: Payer: Self-pay

## 2014-08-25 ENCOUNTER — Encounter (HOSPITAL_COMMUNITY): Payer: Self-pay | Admitting: Cardiovascular Disease

## 2014-11-22 IMAGING — CT CT HEART MORP W/ CTA COR W/ SCORE W/ CA W/CM &/OR W/O CM
1 of 12 series · 1 of 20 positions shown, 2 images · IV contrast (CONTRAST)
Comparison: none

ADDENDUM:
OVER-READ INTERPRETATION  CT CHEST

The following report is an over-read performed by radiologist Dr.
Tenecela [[email or web address]], 11/04/2013. This over-read does not
include interpretation of cardiac or coronary anatomy or pathology.
The CTA interpretation by the cardiologist is attached.
CLINICAL DATA: Anomalous coronary origin
EXAM:
Cardiac/Coronary CT
TECHNIQUE: The patient was scanned on a Philips 256 scanner. A 120 kV
retrospective scan was triggered in the descending thoracic aorta at
111 HU's. Gantry rotation speed was 270 msecs and collimation was .9
mm. 50 mg of PO Metoprolol and 0.4 mg of sl NTG was given. The 3D
data set was reconstructed in 10% intervals of the R-R cycle.
Systolic and diastolic phases were analyzed on a dedicated work
station using MPR, MIP and VRT modes. The patient received 80 cc of
contrast.

[Series 3: locator · axial · 0.35mm/px · z∈[-154,-154]mm · 1 of 1 slices shown, 2 images]
[im 1/1  vessel]
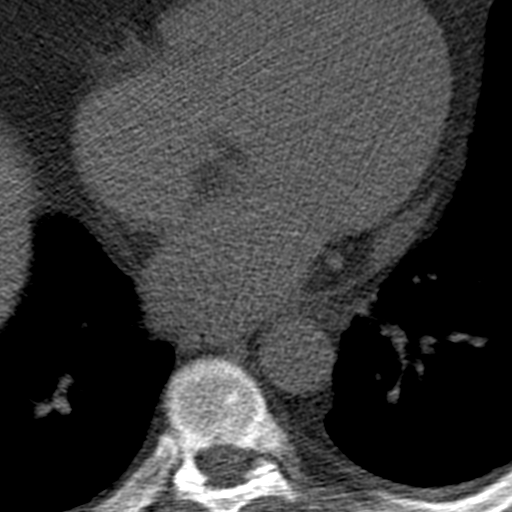
[im 1/1  lung]
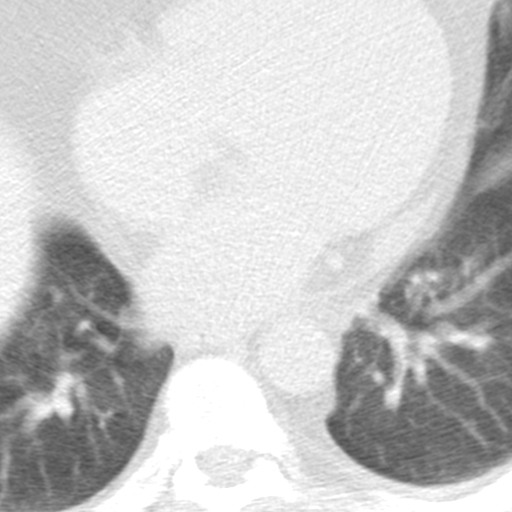

[1 of 20 positions shown; findings below may reference images not displayed]

FINDINGS: Within the visualized portions of the thorax there is no acute
consolidative airspace disease, no pleural effusions and no
pneumothorax. No definite suspicious appearing pulmonary nodules or
masses are identified within the visualized portions of the lungs.
Visualized portions of the upper abdomen are unremarkable. There are
no aggressive appearing lytic or blastic lesions noted in the
visualized portions of the skeleton.
IMPRESSION: 1. No significant incidental noncardiac findings noted.
FINDINGS: Aorta: Normal size of the aortic root and ascending aorta. No
calcifications.

Aortic Valve:  Normal, trileaflet, no restricted leaflet opening.

Coronary Arteries:

Left main originated normally at the left sinus of Valsalva and it
gives rise to LAD and ramus intermedius.

LAD wraps around the apex and has no plague. The first septal
perforator has no obvious plague.

Ramus is a moderate size vessel that supplies the lateral wall and
has no obvious plague.

RCA originates normally at the right sinus of Valsalva. It is a
large dominant vessel that gives rise to 2 PDA vessels and a
posterolateral branch. There is no plague in the RCA territory.

Left circumflex artery arises at the right coronary sinus in the
proximity to the origin of the RCA. LCX is a large vessel that
courses posteriorly and runs between non-coronary sinus and the left
atrium and continues to the left AV groove. It gives rise to three
moderate-sized obtuse marginal branches that supply the basal
lateral portion of the left ventricle. There is no plague in the LCX
territory.
IMPRESSION: 1. Anomalous origin of the left circumflex artery at the right sinus
of Valsalva with benign posterior course.

2. No coronary artery disease.

Pujiyono Lingga

## 2015-01-29 ENCOUNTER — Emergency Department (HOSPITAL_COMMUNITY)
Admission: EM | Admit: 2015-01-29 | Discharge: 2015-01-29 | Disposition: A | Discharge status: Home / Self Care | Payer: Medicare Other | Attending: Emergency Medicine | Admitting: Emergency Medicine

## 2015-01-29 ENCOUNTER — Encounter (HOSPITAL_COMMUNITY): Payer: Self-pay | Admitting: *Deleted

## 2015-01-29 ENCOUNTER — Emergency Department (HOSPITAL_COMMUNITY): Payer: Medicare Other

## 2015-01-29 DIAGNOSIS — M79671 Pain in right foot: Secondary | ICD-10-CM | POA: Diagnosis not present

## 2015-01-29 DIAGNOSIS — S199XXA Unspecified injury of neck, initial encounter: Secondary | ICD-10-CM | POA: Insufficient documentation

## 2015-01-29 DIAGNOSIS — Y998 Other external cause status: Secondary | ICD-10-CM | POA: Insufficient documentation

## 2015-01-29 DIAGNOSIS — M25462 Effusion, left knee: Secondary | ICD-10-CM | POA: Diagnosis not present

## 2015-01-29 DIAGNOSIS — E669 Obesity, unspecified: Secondary | ICD-10-CM | POA: Diagnosis not present

## 2015-01-29 DIAGNOSIS — Z9981 Dependence on supplemental oxygen: Secondary | ICD-10-CM | POA: Diagnosis not present

## 2015-01-29 DIAGNOSIS — S8991XA Unspecified injury of right lower leg, initial encounter: Secondary | ICD-10-CM | POA: Diagnosis not present

## 2015-01-29 DIAGNOSIS — S299XXA Unspecified injury of thorax, initial encounter: Secondary | ICD-10-CM | POA: Diagnosis not present

## 2015-01-29 DIAGNOSIS — Y9389 Activity, other specified: Secondary | ICD-10-CM | POA: Insufficient documentation

## 2015-01-29 DIAGNOSIS — M25561 Pain in right knee: Secondary | ICD-10-CM | POA: Diagnosis not present

## 2015-01-29 DIAGNOSIS — S3992XA Unspecified injury of lower back, initial encounter: Secondary | ICD-10-CM | POA: Diagnosis not present

## 2015-01-29 DIAGNOSIS — G43909 Migraine, unspecified, not intractable, without status migrainosus: Secondary | ICD-10-CM | POA: Diagnosis not present

## 2015-01-29 DIAGNOSIS — M25562 Pain in left knee: Secondary | ICD-10-CM | POA: Diagnosis not present

## 2015-01-29 DIAGNOSIS — F329 Major depressive disorder, single episode, unspecified: Secondary | ICD-10-CM | POA: Insufficient documentation

## 2015-01-29 DIAGNOSIS — Z880 Allergy status to penicillin: Secondary | ICD-10-CM | POA: Insufficient documentation

## 2015-01-29 DIAGNOSIS — Z79899 Other long term (current) drug therapy: Secondary | ICD-10-CM | POA: Insufficient documentation

## 2015-01-29 DIAGNOSIS — S8992XA Unspecified injury of left lower leg, initial encounter: Secondary | ICD-10-CM | POA: Diagnosis present

## 2015-01-29 DIAGNOSIS — W1789XA Other fall from one level to another, initial encounter: Secondary | ICD-10-CM | POA: Diagnosis not present

## 2015-01-29 DIAGNOSIS — G40909 Epilepsy, unspecified, not intractable, without status epilepticus: Secondary | ICD-10-CM | POA: Diagnosis not present

## 2015-01-29 DIAGNOSIS — T148 Other injury of unspecified body region: Secondary | ICD-10-CM | POA: Diagnosis not present

## 2015-01-29 DIAGNOSIS — Z8701 Personal history of pneumonia (recurrent): Secondary | ICD-10-CM | POA: Insufficient documentation

## 2015-01-29 DIAGNOSIS — R0789 Other chest pain: Secondary | ICD-10-CM | POA: Diagnosis not present

## 2015-01-29 DIAGNOSIS — K219 Gastro-esophageal reflux disease without esophagitis: Secondary | ICD-10-CM | POA: Diagnosis not present

## 2015-01-29 DIAGNOSIS — M542 Cervicalgia: Secondary | ICD-10-CM | POA: Diagnosis not present

## 2015-01-29 DIAGNOSIS — S99921A Unspecified injury of right foot, initial encounter: Secondary | ICD-10-CM | POA: Diagnosis not present

## 2015-01-29 DIAGNOSIS — M1990 Unspecified osteoarthritis, unspecified site: Secondary | ICD-10-CM | POA: Insufficient documentation

## 2015-01-29 DIAGNOSIS — Z87891 Personal history of nicotine dependence: Secondary | ICD-10-CM | POA: Diagnosis not present

## 2015-01-29 DIAGNOSIS — T07XXXA Unspecified multiple injuries, initial encounter: Secondary | ICD-10-CM

## 2015-01-29 DIAGNOSIS — M545 Low back pain: Secondary | ICD-10-CM | POA: Diagnosis not present

## 2015-01-29 DIAGNOSIS — G4733 Obstructive sleep apnea (adult) (pediatric): Secondary | ICD-10-CM | POA: Diagnosis not present

## 2015-01-29 DIAGNOSIS — S80811A Abrasion, right lower leg, initial encounter: Secondary | ICD-10-CM | POA: Diagnosis not present

## 2015-01-29 DIAGNOSIS — Y9289 Other specified places as the place of occurrence of the external cause: Secondary | ICD-10-CM | POA: Insufficient documentation

## 2015-01-29 MED ORDER — NAPROXEN 375 MG PO TABS: 375.0000 mg | ORAL_TABLET | Freq: Two times a day (BID) | ORAL | Status: AC

## 2015-01-29 MED ORDER — OXYCODONE-ACETAMINOPHEN 5-325 MG PO TABS
1.0000 | ORAL_TABLET | Freq: Once | ORAL | Status: AC
  Administered 2015-01-29: 1 via ORAL
  Filled 2015-01-29: qty 1

## 2015-01-29 MED ORDER — CYCLOBENZAPRINE HCL 10 MG PO TABS: 10.0000 mg | ORAL_TABLET | Freq: Two times a day (BID) | ORAL | Status: AC | PRN

## 2015-01-29 MED ORDER — OXYCODONE-ACETAMINOPHEN 10-325 MG PO TABS: 1.0000 | ORAL_TABLET | Freq: Four times a day (QID) | ORAL | Status: AC | PRN

## 2015-01-29 MED ORDER — CYCLOBENZAPRINE HCL 10 MG PO TABS
5.0000 mg | ORAL_TABLET | Freq: Once | ORAL | Status: AC
  Administered 2015-01-29: 5 mg via ORAL
  Filled 2015-01-29: qty 1

## 2015-01-29 NOTE — ED Notes (Signed)
Pt states he fell approximately 5 feet off a step ladder while trimming a tree. Pt c/o left knee, right lower leg, and low back pain. Pt denies LOC

## 2015-01-29 NOTE — ED Notes (Signed)
Pt. Left with all belongings 

## 2015-01-29 NOTE — Discharge Instructions (Signed)
Contusion A contusion is a deep bruise. Contusions are the result of an injury that caused bleeding under the skin. The contusion may turn blue, purple, or yellow. Minor injuries will give you a painless contusion, but more severe contusions may stay painful and swollen for a few weeks.  CAUSES  A contusion is usually caused by a blow, trauma, or direct force to an area of the body. SYMPTOMS   Swelling and redness of the injured area.  Bruising of the injured area.  Tenderness and soreness of the injured area.  Pain. DIAGNOSIS  The diagnosis can be made by taking a history and physical exam. An X-ray, CT scan, or MRI may be needed to determine if there were any associated injuries, such as fractures. TREATMENT  Specific treatment will depend on what area of the body was injured. In general, the best treatment for a contusion is resting, icing, elevating, and applying cold compresses to the injured area. Over-the-counter medicines may also be recommended for pain control. Ask your caregiver what the best treatment is for your contusion. HOME CARE INSTRUCTIONS   Put ice on the injured area.  Put ice in a plastic bag.  Place a towel between your skin and the bag.  Leave the ice on for 15-20 minutes, 3-4 times a day, or as directed by your health care provider.  Only take over-the-counter or prescription medicines for pain, discomfort, or fever as directed by your caregiver. Your caregiver may recommend avoiding anti-inflammatory medicines (aspirin, ibuprofen, and naproxen) for 48 hours because these medicines may increase bruising.  Rest the injured area.  If possible, elevate the injured area to reduce swelling. SEEK IMMEDIATE MEDICAL CARE IF:   You have increased bruising or swelling.  You have pain that is getting worse.  Your swelling or pain is not relieved with medicines. MAKE SURE YOU:   Understand these instructions.  Will watch your condition.  Will get help right  away if you are not doing well or get worse. Document Released: 06/12/2005 Document Revised: 09/07/2013 Document Reviewed: 07/08/2011 [facility] Patient Information 2015 [place], [place]. This information is not intended to replace advice given to you by your health care provider. Make sure you discuss any questions you have with your health care provider.  Knee Pain The knee is the complex joint between your thigh and your lower leg. It is made up of bones, tendons, ligaments, and cartilage. The bones that make up the knee are:  The femur in the thigh.  The tibia and fibula in the lower leg.  The patella or kneecap riding in the groove on the lower femur. CAUSES  Knee pain is a common complaint with many causes. A few of these causes are:  Injury, such as:  A ruptured ligament or tendon injury.  Torn cartilage.  Medical conditions, such as:  Gout  Arthritis  Infections  Overuse, over training, or overdoing a physical activity. Knee pain can be minor or severe. Knee pain can accompany debilitating injury. Minor knee problems often respond well to self-care measures or get well on their own. More serious injuries may need medical intervention or even surgery. SYMPTOMS The knee is complex. Symptoms of knee problems can vary widely. Some of the problems are:  Pain with movement and weight bearing.  Swelling and tenderness.  Buckling of the knee.  Inability to straighten or extend your knee.  Your knee locks and you cannot straighten it.  Warmth and redness with pain and fever.  Deformity or  dislocation of the kneecap. DIAGNOSIS  Determining what is wrong may be very straight forward such as when there is an injury. It can also be challenging because of the complexity of the knee. Tests to make a diagnosis may include:  Your caregiver taking a history and doing a physical exam.  Routine X-rays can be used to rule out other problems. X-rays will not reveal a cartilage  tear. Some injuries of the knee can be diagnosed by:  Arthroscopy a surgical technique by which a small video camera is inserted through tiny incisions on the sides of the knee. This procedure is used to examine and repair internal knee joint problems. Tiny instruments can be used during arthroscopy to repair the torn knee cartilage (meniscus).  Arthrography is a radiology technique. A contrast liquid is directly injected into the knee joint. Internal structures of the knee joint then become visible on X-ray film.  An MRI scan is a non X-ray radiology procedure in which magnetic fields and a computer produce two- or three-dimensional images of the inside of the knee. Cartilage tears are often visible using an MRI scanner. MRI scans have largely replaced arthrography in diagnosing cartilage tears of the knee.  Blood work.  Examination of the fluid that helps to lubricate the knee joint (synovial fluid). This is done by taking a sample out using a needle and a syringe. TREATMENT The treatment of knee problems depends on the cause. Some of these treatments are:  Depending on the injury, proper casting, splinting, surgery, or physical therapy care will be needed.  Give yourself adequate recovery time. Do not overuse your joints. If you begin to get sore during workout routines, back off. Slow down or do fewer repetitions.  For repetitive activities such as cycling or running, maintain your strength and nutrition.  Alternate muscle groups. For example, if you are a weight lifter, work the upper body on one day and the lower body the next.  Either tight or weak muscles do not give the proper support for your knee. Tight or weak muscles do not absorb the stress placed on the knee joint. Keep the muscles surrounding the knee strong.  Take care of mechanical problems.  If you have flat feet, orthotics or special shoes may help. See your caregiver if you need help.  Arch supports, sometimes with  wedges on the inner or outer aspect of the heel, can help. These can shift pressure away from the side of the knee most bothered by osteoarthritis.  A brace called an "unloader" brace also may be used to help ease the pressure on the most arthritic side of the knee.  If your caregiver has prescribed crutches, braces, wraps or ice, use as directed. The acronym for this is PRICE. This means protection, rest, ice, compression, and elevation.  Nonsteroidal anti-inflammatory drugs (NSAIDs), can help relieve pain. But if taken immediately after an injury, they may actually increase swelling. Take NSAIDs with food in your stomach. Stop them if you develop stomach problems. Do not take these if you have a history of ulcers, stomach pain, or bleeding from the bowel. Do not take without your caregiver's approval if you have problems with fluid retention, heart failure, or kidney problems.  For ongoing knee problems, physical therapy may be helpful.  Glucosamine and chondroitin are over-the-counter dietary supplements. Both may help relieve the pain of osteoarthritis in the knee. These medicines are different from the usual anti-inflammatory drugs. Glucosamine may decrease the rate of cartilage destruction.  Injections of a corticosteroid drug into your knee joint may help reduce the symptoms of an arthritis flare-up. They may provide pain relief that lasts a few months. You may have to wait a few months between injections. The injections do have a small increased risk of infection, water retention, and elevated blood sugar levels.  Hyaluronic acid injected into damaged joints may ease pain and provide lubrication. These injections may work by reducing inflammation. A series of shots may give relief for as long as 6 months.  Topical painkillers. Applying certain ointments to your skin may help relieve the pain and stiffness of osteoarthritis. Ask your pharmacist for suggestions. Many over the-counter products  are approved for temporary relief of arthritis pain.  In some countries, doctors often prescribe topical NSAIDs for relief of chronic conditions such as arthritis and tendinitis. A review of treatment with NSAID creams found that they worked as well as oral medications but without the serious side effects. PREVENTION  Maintain a healthy weight. Extra pounds put more strain on your joints.  Get strong, stay limber. Weak muscles are a common cause of knee injuries. Stretching is important. Include flexibility exercises in your workouts.  Be smart about exercise. If you have osteoarthritis, chronic knee pain or recurring injuries, you may need to change the way you exercise. This does not mean you have to stop being active. If your knees ache after jogging or playing basketball, consider switching to swimming, water aerobics, or other low-impact activities, at least for a few days a week. Sometimes limiting high-impact activities will provide relief.  Make sure your shoes fit well. Choose footwear that is right for your sport.  Protect your knees. Use the proper gear for knee-sensitive activities. Use kneepads when playing volleyball or laying carpet. Buckle your seat belt every time you drive. Most shattered kneecaps occur in car accidents.  Rest when you are tired. SEEK MEDICAL CARE IF:  You have knee pain that is continual and does not seem to be getting better.  SEEK IMMEDIATE MEDICAL CARE IF:  Your knee joint feels hot to the touch and you have a high fever. MAKE SURE YOU:   Understand these instructions.  Will watch your condition.  Will get help right away if you are not doing well or get worse. Document Released: 06/30/2007 Document Revised: 11/25/2011 Document Reviewed: 06/30/2007 [facility] Patient Information 2015 [place], [place]. This information is not intended to replace advice given to you by your health care provider. Make sure you discuss any questions you have with your  health care provider.  Knee Bracing Knee braces are supports to help stabilize and protect an injured or painful knee. They come in many different styles. They should support and protect the knee without increasing the chance of other injuries to yourself or others. It is important not to have a false sense of security when using a brace. Knee braces that help you to keep using your knee:  Do not restore normal knee stability under high stress forces.  May decrease some aspects of athletic performance. Some of the different types of knee braces are:  Prophylactic knee braces are designed to prevent or reduce the severity of knee injuries during sports that make injury to the knee more likely.  Rehabilitative knee braces are designed to allow protected motion of:  Injured knees.  Knees that have been treated with or without surgery. There is no evidence that the use of a supportive knee brace protects the graft following a successful  anterior cruciate ligament (ACL) reconstruction. However, braces are sometimes used to:   Protect injured ligaments.  Control knee movement during the initial healing period. They may be used as part of the treatment program for the various injured ligaments or cartilage of the knee including the:  Anterior cruciate ligament.  Medial collateral ligament.  Medial or lateral cartilage (meniscus).  Posterior cruciate ligament.  Lateral collateral ligament. Rehabilitative knee braces are most commonly used:  During crutch-assisted walking right after injury.  During crutch-assisted walking right after surgery to repair the cartilage and/or cruciate ligament injury.  For a short period of time, 2-8 weeks, after the injury or surgery. The value of a rehabilitative brace as opposed to a cast or splint includes the:  Ability to adjust the brace for swelling.  Ability to remove the brace for examinations, icing, or showering.  Ability to allow for  movement in a controlled range of motion. Functional knee braces give support to knees that have already been injured. They are designed to provide stability for the injured knee and provide protection after repair. Functional knee braces may not affect performance much. Lower extremity muscle strengthening, flexibility, and improvement in technique are more important than bracing in treating ligamentous knee injuries. Functional braces are not a substitute for rehabilitation or surgical procedures. Unloader/off-loader braces are designed to provide pain relief in arthritic knees. Patients with wear and tear arthritis from growing old or from an old cartilage injury (osteoarthritis) of the knee, and bowlegged (varus) or knock-knee (valgus) deformities, often develop increased pain in the arthritic side due to increased loading. Unloader/off-loader braces are made to reduce uneven loading in such knees. There is reduction in bowing out movement in bowlegged knees when the correct unloader brace is used. Patients with advanced osteoarthritis or severe varus or valgus alignment problems would not likely benefit from bracing. Patellofemoral braces help the kneecap to move smoothly and well centered over the end of the femur in the knee.  Most people who wear knee braces feel that they help. However, there is a lack of scientific evidence that knee braces are helpful at the level needed for athletic participation to prevent injury. In spite of this, athletes report an increase in knee stability, pain relief, performance improvement, and confidence during athletics when using a brace.  Different knee problems require different knee braces:  Your caregiver may suggest one kind of knee brace after knee surgery.  A caregiver may choose another kind of knee brace for support instead of surgery for some types of torn ligaments.  You may also need one for pain in the front of your knee that is not getting better with  strengthening and flexibility exercises. Get your caregiver's advice if you want to try a knee brace. The caregiver will advise you on where to get them and provide a prescription when it is needed to fashion and/or fit the brace. Knee braces are the least important part of preventing knee injuries or getting better following injury. Stretching, strengthening and technique improvement are far more important in caring for and preventing knee injuries. When strengthening your knee, increase your activities a little at a time so as not to develop injuries from overuse. Work out an exercise plan with your caregiver and/or physical therapist to get the best program for you. Do not let a knee brace become a crutch. Always remember, there are no braces which support the knee as well as your original ligaments and cartilage you were born with. Conditioning,  proper warm-up, and stretching remain the most important parts of keeping your knees healthy. HOW TO USE A KNEE BRACE  During sports, knee braces should be used as directed by your caregiver.  Make sure that the hinges are where the knee bends.  Straps, tapes, or hook-and-loop tapes should be fastened around your leg as instructed.  You should check the placement of the brace during activities to make sure that it has not moved. Poorly positioned braces can hurt rather than help you.  To work well, a knee brace should be worn during all activities that put you at risk of knee injury.  Warm up properly before beginning athletic activities. HOME CARE INSTRUCTIONS  Knee braces often get damaged during normal use. Replace worn-out braces for maximum benefit.  Clean regularly with soap and water.  Inspect your brace often for wear and tear.  Cover exposed metal to protect others from injury.  Durable materials may cost more, but last longer. SEEK IMMEDIATE MEDICAL CARE IF:   Your knee seems to be getting worse rather than better.  You have  increasing pain or swelling in the knee.  You have problems caused by the knee brace.  You have increased swelling or inflammation (redness or soreness) in your knee.  Your knee becomes warm and more painful and you develop an unexplained temperature over 101F (38.3C). MAKE SURE YOU:   Understand these instructions.  Will watch your condition.  Will get help right away if you are not doing well or get worse. See your caregiver, physical therapist, or orthopedic surgeon for additional information. Document Released: 11/23/2003 Document Revised: 01/17/2014 Document Reviewed: 03/01/2009 [facility] Patient Information 2015 [place], [place]. This information is not intended to replace advice given to you by your health care provider. Make sure you discuss any questions you have with your health care provider. Fall Prevention and Home Safety Falls cause injuries and can affect all age groups. It is possible to use preventive measures to significantly decrease the likelihood of falls. There are many simple measures which can make your home safer and prevent falls. OUTDOORS  Repair cracks and edges of walkways and driveways.  Remove high doorway thresholds.  Trim shrubbery on the main path into your home.  Have good outside lighting.  Clear walkways of tools, rocks, debris, and clutter.  Check that handrails are not broken and are securely fastened. Both sides of steps should have handrails.  Have leaves, snow, and ice cleared regularly.  Use sand or salt on walkways during winter months.  In the garage, clean up grease or oil spills. BATHROOM  Install night lights.  Install grab bars by the toilet and in the tub and shower.  Use non-skid mats or decals in the tub or shower.  Place a plastic non-slip stool in the shower to sit on, if needed.  Keep floors dry and clean up all water on the floor immediately.  Remove soap buildup in the tub or shower on a regular  basis.  Secure bath mats with non-slip, double-sided rug tape.  Remove throw rugs and tripping hazards from the floors. BEDROOMS  Install night lights.  Make sure a bedside light is easy to reach.  Do not use oversized bedding.  Keep a telephone by your bedside.  Have a firm chair with side arms to use for getting dressed.  Remove throw rugs and tripping hazards from the floor. KITCHEN  Keep handles on pots and pans turned toward the center of the stove. Use  back burners when possible.  Clean up spills quickly and allow time for drying.  Avoid walking on wet floors.  Avoid hot utensils and knives.  Position shelves so they are not too high or low.  Place commonly used objects within easy reach.  If necessary, use a sturdy step stool with a grab bar when reaching.  Keep electrical cables out of the way.  Do not use floor polish or wax that makes floors slippery. If you must use wax, use non-skid floor wax.  Remove throw rugs and tripping hazards from the floor. STAIRWAYS  Never leave objects on stairs.  Place handrails on both sides of stairways and use them. Fix any loose handrails. Make sure handrails on both sides of the stairways are as long as the stairs.  Check carpeting to make sure it is firmly attached along stairs. Make repairs to worn or loose carpet promptly.  Avoid placing throw rugs at the top or bottom of stairways, or properly secure the rug with carpet tape to prevent slippage. Get rid of throw rugs, if possible.  Have an electrician put in a light switch at the top and bottom of the stairs. OTHER FALL PREVENTION TIPS  Wear low-heel or rubber-soled shoes that are supportive and fit well. Wear closed toe shoes.  When using a stepladder, make sure it is fully opened and both spreaders are firmly locked. Do not climb a closed stepladder.  Add color or contrast paint or tape to grab bars and handrails in your home. Place contrasting color strips on  first and last steps.  Learn and use mobility aids as needed. Install an electrical emergency response system.  Turn on lights to avoid dark areas. Replace light bulbs that burn out immediately. Get light switches that glow.  Arrange furniture to create clear pathways. Keep furniture in the same place.  Firmly attach carpet with non-skid or double-sided tape.  Eliminate uneven floor surfaces.  Select a carpet pattern that does not visually hide the edge of steps.  Be aware of all pets. OTHER HOME SAFETY TIPS  Set the water temperature for 120 F (48.8 C).  Keep emergency numbers on or near the telephone.  Keep smoke detectors on every level of the home and near sleeping areas. Document Released: 08/23/2002 Document Revised: 03/03/2012 Document Reviewed: 11/22/2011 [facility] Patient Information 2015 [place], [place]. This information is not intended to replace advice given to you by your health care provider. Make sure you discuss any questions you have with your health care provider.
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
## 2015-02-06 DIAGNOSIS — M25562 Pain in left knee: Secondary | ICD-10-CM | POA: Diagnosis not present

## 2015-02-06 DIAGNOSIS — M25561 Pain in right knee: Secondary | ICD-10-CM | POA: Diagnosis not present

## 2015-05-12 ENCOUNTER — Encounter (HOSPITAL_COMMUNITY): Payer: Self-pay | Admitting: *Deleted

## 2015-05-12 ENCOUNTER — Emergency Department (HOSPITAL_COMMUNITY)
Admission: EM | Admit: 2015-05-12 | Discharge: 2015-05-12 | Disposition: A | Discharge status: Home / Self Care | Payer: Medicare Other | Attending: Emergency Medicine | Admitting: Emergency Medicine

## 2015-05-12 DIAGNOSIS — S81812A Laceration without foreign body, left lower leg, initial encounter: Secondary | ICD-10-CM | POA: Insufficient documentation

## 2015-05-12 DIAGNOSIS — Y998 Other external cause status: Secondary | ICD-10-CM | POA: Diagnosis not present

## 2015-05-12 DIAGNOSIS — F329 Major depressive disorder, single episode, unspecified: Secondary | ICD-10-CM | POA: Insufficient documentation

## 2015-05-12 DIAGNOSIS — K219 Gastro-esophageal reflux disease without esophagitis: Secondary | ICD-10-CM | POA: Insufficient documentation

## 2015-05-12 DIAGNOSIS — Y9289 Other specified places as the place of occurrence of the external cause: Secondary | ICD-10-CM | POA: Diagnosis not present

## 2015-05-12 DIAGNOSIS — W2203XA Walked into furniture, initial encounter: Secondary | ICD-10-CM | POA: Insufficient documentation

## 2015-05-12 DIAGNOSIS — E785 Hyperlipidemia, unspecified: Secondary | ICD-10-CM | POA: Diagnosis not present

## 2015-05-12 DIAGNOSIS — Z9981 Dependence on supplemental oxygen: Secondary | ICD-10-CM | POA: Insufficient documentation

## 2015-05-12 DIAGNOSIS — Z880 Allergy status to penicillin: Secondary | ICD-10-CM | POA: Insufficient documentation

## 2015-05-12 DIAGNOSIS — Z8701 Personal history of pneumonia (recurrent): Secondary | ICD-10-CM | POA: Insufficient documentation

## 2015-05-12 DIAGNOSIS — Z79899 Other long term (current) drug therapy: Secondary | ICD-10-CM | POA: Diagnosis not present

## 2015-05-12 DIAGNOSIS — Y9301 Activity, walking, marching and hiking: Secondary | ICD-10-CM | POA: Insufficient documentation

## 2015-05-12 DIAGNOSIS — E669 Obesity, unspecified: Secondary | ICD-10-CM | POA: Insufficient documentation

## 2015-05-12 DIAGNOSIS — Z87891 Personal history of nicotine dependence: Secondary | ICD-10-CM | POA: Insufficient documentation

## 2015-05-12 DIAGNOSIS — G4733 Obstructive sleep apnea (adult) (pediatric): Secondary | ICD-10-CM | POA: Insufficient documentation

## 2015-05-12 MED ORDER — OXYCODONE-ACETAMINOPHEN 5-325 MG PO TABS
1.0000 | ORAL_TABLET | Freq: Once | ORAL | Status: AC
  Administered 2015-05-12: 1 via ORAL
  Filled 2015-05-12: qty 1

## 2015-05-12 NOTE — ED Notes (Signed)
Pt states that he was walking through the woods when he tripped and had a branch "punture" his leg. Bleeding controlled at this time with dressing from home.

## 2015-05-12 NOTE — Discharge Instructions (Signed)
-   Go to your PCP or urgent care in 8-10 days for suture removal - Keep wound clean and covered - Return to ED with fevers, chills, redness around the wound, drainage from the wound, weakness or numbness in left foot or further worsening of symptoms
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
## 2015-05-17 ENCOUNTER — Emergency Department (HOSPITAL_COMMUNITY): Payer: Medicare Other

## 2015-05-17 ENCOUNTER — Emergency Department (HOSPITAL_COMMUNITY)
Admission: EM | Admit: 2015-05-17 | Discharge: 2015-05-17 | Disposition: A | Discharge status: Home / Self Care | Payer: Medicare Other | Attending: Emergency Medicine | Admitting: Emergency Medicine

## 2015-05-17 ENCOUNTER — Emergency Department (HOSPITAL_BASED_OUTPATIENT_CLINIC_OR_DEPARTMENT_OTHER): Payer: Medicare Other

## 2015-05-17 ENCOUNTER — Encounter (HOSPITAL_COMMUNITY): Payer: Self-pay | Admitting: *Deleted

## 2015-05-17 DIAGNOSIS — G40909 Epilepsy, unspecified, not intractable, without status epilepticus: Secondary | ICD-10-CM | POA: Insufficient documentation

## 2015-05-17 DIAGNOSIS — Z880 Allergy status to penicillin: Secondary | ICD-10-CM | POA: Diagnosis not present

## 2015-05-17 DIAGNOSIS — R079 Chest pain, unspecified: Secondary | ICD-10-CM | POA: Diagnosis not present

## 2015-05-17 DIAGNOSIS — G43909 Migraine, unspecified, not intractable, without status migrainosus: Secondary | ICD-10-CM | POA: Diagnosis not present

## 2015-05-17 DIAGNOSIS — E669 Obesity, unspecified: Secondary | ICD-10-CM | POA: Insufficient documentation

## 2015-05-17 DIAGNOSIS — Z8701 Personal history of pneumonia (recurrent): Secondary | ICD-10-CM | POA: Insufficient documentation

## 2015-05-17 DIAGNOSIS — S81802A Unspecified open wound, left lower leg, initial encounter: Secondary | ICD-10-CM | POA: Diagnosis not present

## 2015-05-17 DIAGNOSIS — R52 Pain, unspecified: Secondary | ICD-10-CM

## 2015-05-17 DIAGNOSIS — Z8739 Personal history of other diseases of the musculoskeletal system and connective tissue: Secondary | ICD-10-CM | POA: Insufficient documentation

## 2015-05-17 DIAGNOSIS — K219 Gastro-esophageal reflux disease without esophagitis: Secondary | ICD-10-CM | POA: Insufficient documentation

## 2015-05-17 DIAGNOSIS — Z9981 Dependence on supplemental oxygen: Secondary | ICD-10-CM | POA: Insufficient documentation

## 2015-05-17 DIAGNOSIS — L03116 Cellulitis of left lower limb: Secondary | ICD-10-CM | POA: Diagnosis not present

## 2015-05-17 DIAGNOSIS — G4733 Obstructive sleep apnea (adult) (pediatric): Secondary | ICD-10-CM | POA: Diagnosis not present

## 2015-05-17 DIAGNOSIS — R0789 Other chest pain: Secondary | ICD-10-CM | POA: Diagnosis not present

## 2015-05-17 DIAGNOSIS — Z862 Personal history of diseases of the blood and blood-forming organs and certain disorders involving the immune mechanism: Secondary | ICD-10-CM | POA: Diagnosis not present

## 2015-05-17 DIAGNOSIS — Z79899 Other long term (current) drug therapy: Secondary | ICD-10-CM | POA: Insufficient documentation

## 2015-05-17 DIAGNOSIS — Z87891 Personal history of nicotine dependence: Secondary | ICD-10-CM | POA: Insufficient documentation

## 2015-05-17 DIAGNOSIS — M7989 Other specified soft tissue disorders: Secondary | ICD-10-CM

## 2015-05-17 DIAGNOSIS — F329 Major depressive disorder, single episode, unspecified: Secondary | ICD-10-CM | POA: Diagnosis not present

## 2015-05-17 LAB — BASIC METABOLIC PANEL
Anion gap: 4 — ABNORMAL LOW (ref 5–15)
BUN: 13 mg/dL (ref 6–20)
CALCIUM: 8.7 mg/dL — AB (ref 8.9–10.3)
CHLORIDE: 112 mmol/L — AB (ref 101–111)
CO2: 25 mmol/L (ref 22–32)
CREATININE: 0.95 mg/dL (ref 0.61–1.24)
GFR calc Af Amer: >60 mL/min (ref >60)
GFR calc non Af Amer: >60 mL/min (ref >60)
Glucose, Bld: 106 mg/dL — ABNORMAL HIGH (ref 65–99)
Potassium: 4.9 mmol/L (ref 3.5–5.1)
SODIUM: 141 mmol/L (ref 135–145)

## 2015-05-17 LAB — CBC
HEMATOCRIT: 39.8 % (ref 39.0–52.0)
Hemoglobin: 12.7 g/dL — ABNORMAL LOW (ref 13.0–17.0)
MCH: 27.3 pg (ref 26.0–34.0)
MCHC: 31.9 g/dL (ref 30.0–36.0)
MCV: 85.4 fL (ref 78.0–100.0)
Platelets: 310 10*3/uL (ref 150–400)
RBC: 4.66 MIL/uL (ref 4.22–5.81)
RDW: 14.8 % (ref 11.5–15.5)
WBC: 6.1 10*3/uL (ref 4.0–10.5)

## 2015-05-17 LAB — DIFFERENTIAL
Basophils Absolute: 0 10*3/uL (ref 0.0–0.1)
Basophils Relative: 1 % (ref 0–1)
EOS ABS: 0.3 10*3/uL (ref 0.0–0.7)
EOS PCT: 5 % (ref 0–5)
LYMPHS ABS: 1.5 10*3/uL (ref 0.7–4.0)
LYMPHS PCT: 23 % (ref 12–46)
MONO ABS: 0.6 10*3/uL (ref 0.1–1.0)
Monocytes Relative: 10 % (ref 3–12)
Neutro Abs: 3.8 10*3/uL (ref 1.7–7.7)
Neutrophils Relative %: 61 % (ref 43–77)

## 2015-05-17 LAB — HEPATIC FUNCTION PANEL
ALT: 18 U/L (ref 17–63)
AST: 19 U/L (ref 15–41)
Albumin: 3.6 g/dL (ref 3.5–5.0)
Alkaline Phosphatase: 72 U/L (ref 38–126)
BILIRUBIN TOTAL: 0.5 mg/dL (ref 0.3–1.2)
Total Protein: 6 g/dL — ABNORMAL LOW (ref 6.5–8.1)

## 2015-05-17 LAB — I-STAT TROPONIN, ED: Troponin i, poc: 0.01 ng/mL (ref 0.00–0.08)

## 2015-05-17 LAB — D-DIMER, QUANTITATIVE: D-Dimer, Quant: 0.44 ug/mL-FEU (ref 0.00–0.48)

## 2015-05-17 LAB — I-STAT CG4 LACTIC ACID, ED: Lactic Acid, Venous: 0.91 mmol/L (ref 0.5–2.0)

## 2015-05-17 LAB — PROTIME-INR
INR: 0.98 (ref 0.00–1.49)
Prothrombin Time: 13.2 seconds (ref 11.6–15.2)

## 2015-05-17 MED ORDER — SULFAMETHOXAZOLE-TRIMETHOPRIM 800-160 MG PO TABS: 1.0000 | ORAL_TABLET | Freq: Two times a day (BID) | ORAL | Status: AC

## 2015-05-17 MED ORDER — VANCOMYCIN HCL IN DEXTROSE 1-5 GM/200ML-% IV SOLN
1000.0000 mg | Freq: Once | INTRAVENOUS | Status: AC
  Administered 2015-05-17: 1000 mg via INTRAVENOUS
  Filled 2015-05-17: qty 200

## 2015-05-17 MED ORDER — ONDANSETRON HCL 4 MG/2ML IJ SOLN
4.0000 mg | Freq: Once | INTRAMUSCULAR | Status: AC
  Administered 2015-05-17: 4 mg via INTRAVENOUS
  Filled 2015-05-17: qty 2

## 2015-05-17 MED ORDER — MORPHINE SULFATE (PF) 4 MG/ML IV SOLN
4.0000 mg | Freq: Once | INTRAVENOUS | Status: AC
  Administered 2015-05-17: 4 mg via INTRAVENOUS
  Filled 2015-05-17: qty 1

## 2015-05-17 MED ORDER — CEPHALEXIN 250 MG PO CAPS
500.0000 mg | ORAL_CAPSULE | Freq: Once | ORAL | Status: AC
  Administered 2015-05-17: 500 mg via ORAL
  Filled 2015-05-17: qty 2

## 2015-05-17 MED ORDER — TRAMADOL HCL 50 MG PO TABS: 50.0000 mg | ORAL_TABLET | Freq: Four times a day (QID) | ORAL | Status: AC | PRN

## 2015-05-17 MED ORDER — SULFAMETHOXAZOLE-TRIMETHOPRIM 800-160 MG PO TABS
1.0000 | ORAL_TABLET | Freq: Once | ORAL | Status: AC
  Administered 2015-05-17: 1 via ORAL
  Filled 2015-05-17: qty 1

## 2015-05-17 MED ORDER — CEPHALEXIN 500 MG PO CAPS: 500.0000 mg | ORAL_CAPSULE | Freq: Four times a day (QID) | ORAL | Status: AC

## 2015-05-17 MED ORDER — SODIUM CHLORIDE 0.9 % IV BOLUS (SEPSIS)
1000.0000 mL | Freq: Once | INTRAVENOUS | Status: AC
  Administered 2015-05-17: 1000 mL via INTRAVENOUS

## 2015-05-17 NOTE — ED Notes (Signed)
Pt reports left leg swelling after a puncture wound last week. Pt has sutures placed. swelling noted to left leg. Pt also reports left sided chest pain that started today. Pt states that he has hx of heart catheterization. Pain started at 12pm while at rest. Pt states that he has had these pains before and they occur while at rest.  Pt denies associated symptoms.

## 2015-05-17 NOTE — ED Notes (Signed)
Per MD pt allowed to eat and drink; meal tray ordered

## 2015-05-17 NOTE — Progress Notes (Signed)
VASCULAR LAB PRELIMINARY  PRELIMINARY  PRELIMINARY  PRELIMINARY  Left lower extremity venous duplex completed.    Preliminary report:  Left:  No evidence of DVT, superficial thrombosis, or Baker's cyst.  Shilah Hefel, RVT 05/17/2015, 5:46 PM
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
## 2015-05-17 NOTE — Discharge Instructions (Signed)
Please follow up with your primary care physician in 1-2 days. If you do not have one please call the [facility] number listed above. Please take your antibiotic until completion. Please read all discharge instructions and return precautions.  ° °Cellulitis °Cellulitis is an infection of the skin and the tissue beneath it. The infected area is usually red and tender. Cellulitis occurs most often in the arms and lower legs.  °CAUSES  °Cellulitis is caused by bacteria that enter the skin through cracks or cuts in the skin. The most common types of bacteria that cause cellulitis are staphylococci and streptococci. °SIGNS AND SYMPTOMS  °· Redness and warmth. °· Swelling. °· Tenderness or pain. °· Fever. °DIAGNOSIS  °Your health care provider can usually determine what is wrong based on a physical exam. Blood tests may also be done. °TREATMENT  °Treatment usually involves taking an antibiotic medicine. °HOME CARE INSTRUCTIONS  °· Take your antibiotic medicine as directed by your health care provider. Finish the antibiotic even if you start to feel better. °· Keep the infected arm or leg elevated to reduce swelling. °· Apply a warm cloth to the affected area up to 4 times per day to relieve pain. °· Take medicines only as directed by your health care provider. °· Keep all follow-up visits as directed by your health care provider. °SEEK MEDICAL CARE IF:  °· You notice red streaks coming from the infected area. °· Your red area gets larger or turns dark in color. °· Your bone or joint underneath the infected area becomes painful after the skin has healed. °· Your infection returns in the same area or another area. °· You notice a swollen bump in the infected area. °· You develop new symptoms. °· You have a fever. °SEEK IMMEDIATE MEDICAL CARE IF:  °· You feel very sleepy. °· You develop vomiting or diarrhea. °· You have a general ill feeling (malaise) with muscle aches and pains. °MAKE SURE YOU:   °· Understand these instructions. °· Will watch your condition. °· Will get help right away if you are not doing well or get worse. °Document Released: 06/12/2005 Document Revised: 01/17/2014 Document Reviewed: 11/18/2011 °ExitCare® Patient Information ©2015 [employer]. This information is not intended to replace advice given to you by your health care provider. Make sure you discuss any questions you have with your health care provider. ° ° ° °

## 2015-05-20 DIAGNOSIS — S81832D Puncture wound without foreign body, left lower leg, subsequent encounter: Secondary | ICD-10-CM | POA: Diagnosis not present

## 2015-05-20 DIAGNOSIS — L03116 Cellulitis of left lower limb: Secondary | ICD-10-CM | POA: Diagnosis not present

## 2015-05-23 DIAGNOSIS — S81802A Unspecified open wound, left lower leg, initial encounter: Secondary | ICD-10-CM | POA: Diagnosis not present

## 2015-05-26 DIAGNOSIS — S81802D Unspecified open wound, left lower leg, subsequent encounter: Secondary | ICD-10-CM | POA: Diagnosis not present

## 2015-06-02 DIAGNOSIS — Z5189 Encounter for other specified aftercare: Secondary | ICD-10-CM | POA: Diagnosis not present

## 2015-06-14 DIAGNOSIS — Z5189 Encounter for other specified aftercare: Secondary | ICD-10-CM | POA: Diagnosis not present

## 2016-08-06 DIAGNOSIS — M25562 Pain in left knee: Secondary | ICD-10-CM | POA: Diagnosis not present

## 2016-08-06 DIAGNOSIS — M25462 Effusion, left knee: Secondary | ICD-10-CM | POA: Diagnosis not present

## 2016-08-06 DIAGNOSIS — Z471 Aftercare following joint replacement surgery: Secondary | ICD-10-CM | POA: Diagnosis not present

## 2016-08-06 DIAGNOSIS — Z96652 Presence of left artificial knee joint: Secondary | ICD-10-CM | POA: Diagnosis not present

## 2016-09-02 ENCOUNTER — Encounter (INDEPENDENT_AMBULATORY_CARE_PROVIDER_SITE_OTHER): Payer: Self-pay | Admitting: Orthopaedic Surgery

## 2016-09-02 ENCOUNTER — Ambulatory Visit (INDEPENDENT_AMBULATORY_CARE_PROVIDER_SITE_OTHER): Payer: Medicare Other | Admitting: Orthopaedic Surgery

## 2016-09-02 DIAGNOSIS — G8929 Other chronic pain: Secondary | ICD-10-CM | POA: Diagnosis not present

## 2016-09-02 DIAGNOSIS — M25561 Pain in right knee: Secondary | ICD-10-CM | POA: Diagnosis not present
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
## 2016-09-05 ENCOUNTER — Ambulatory Visit
Admission: RE | Admit: 2016-09-05 | Discharge: 2016-09-05 | Disposition: A | Discharge status: Home / Self Care | Payer: Medicare Other | Source: Ambulatory Visit | Attending: Orthopaedic Surgery | Admitting: Orthopaedic Surgery

## 2016-09-05 ENCOUNTER — Telehealth (INDEPENDENT_AMBULATORY_CARE_PROVIDER_SITE_OTHER): Payer: Self-pay | Admitting: Orthopaedic Surgery

## 2016-09-05 DIAGNOSIS — G8929 Other chronic pain: Secondary | ICD-10-CM

## 2016-09-05 DIAGNOSIS — M25561 Pain in right knee: Principal | ICD-10-CM

## 2016-09-05 DIAGNOSIS — S83281A Other tear of lateral meniscus, current injury, right knee, initial encounter: Secondary | ICD-10-CM | POA: Diagnosis not present

## 2016-09-05 NOTE — Telephone Encounter (Signed)
Patient called asked if the approval was received  from medicare for his knee injection. The number to contact him is [phone]

## 2016-09-06 NOTE — Telephone Encounter (Signed)
Called pt to let him know we submitted the Monovisc injection Application and it says that the patients ins is invalid and application did not go through.  LMOM with details.

## 2016-09-23 ENCOUNTER — Ambulatory Visit (INDEPENDENT_AMBULATORY_CARE_PROVIDER_SITE_OTHER): Payer: Medicare Other | Admitting: Orthopaedic Surgery

## 2016-09-23 DIAGNOSIS — M1711 Unilateral primary osteoarthritis, right knee: Secondary | ICD-10-CM

## 2016-09-23 DIAGNOSIS — G8929 Other chronic pain: Secondary | ICD-10-CM

## 2016-09-23 DIAGNOSIS — M25561 Pain in right knee: Principal | ICD-10-CM

## 2016-09-23 MED ORDER — HYALURONAN 88 MG/4ML IX SOSY
88.0000 mg | PREFILLED_SYRINGE | INTRA_ARTICULAR | Status: AC | PRN
  Administered 2016-09-23: 88 mg via INTRA_ARTICULAR

## 2016-09-23 NOTE — Progress Notes (Addendum)
Mathew Mercado underwent right knee Monovisc injection under sterile conditions patient tolerated this well. 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 We did discuss possibly arthroscopic debridement to give him some partial and temporary relief.

## 2016-09-23 NOTE — Progress Notes (Signed)
   Procedure Note  Patient: Mathew Mercado             Date of Birth: Nov 09, 1955           MRN: [id number]             Visit Date: 09/23/2016  Procedures: Visit Diagnoses: Chronic pain of right knee  Large Joint Inj Date/Time: 09/23/2016 6:35 PM Performed by: Leandrew Koyanagi Authorized by: Leandrew Koyanagi   Consent Given by:  Patient Timeout: prior to procedure the correct patient, procedure, and site was verified   Indications:  Pain Location:  Knee Site:  R knee Prep: patient was prepped and draped in usual sterile fashion   Needle Size:  22 G Approach:  Anterolateral Ultrasound Guidance: No   Fluoroscopic Guidance: No   Arthrogram: No   Medications:  88 mg Hyaluronan 88 MG/4ML

## 2017-03-10 DIAGNOSIS — Z8669 Personal history of other diseases of the nervous system and sense organs: Secondary | ICD-10-CM | POA: Diagnosis not present

## 2017-03-10 DIAGNOSIS — M7989 Other specified soft tissue disorders: Secondary | ICD-10-CM | POA: Diagnosis not present

## 2017-03-10 DIAGNOSIS — Z8679 Personal history of other diseases of the circulatory system: Secondary | ICD-10-CM | POA: Diagnosis not present

## 2017-03-10 DIAGNOSIS — R600 Localized edema: Secondary | ICD-10-CM | POA: Diagnosis not present

## 2017-03-10 DIAGNOSIS — M79604 Pain in right leg: Secondary | ICD-10-CM | POA: Diagnosis not present

## 2024-04-02 ENCOUNTER — Encounter: Payer: Self-pay | Admitting: Advanced Practice Midwife
